# Patient Record
Sex: Male | Born: 1955 | Race: White | Hispanic: No | Marital: Single | Smoking: Former smoker
Health system: Southern US, Community
[De-identification: ages and names within clinical notes are randomized; demographics above are authoritative.]

## PROBLEM LIST (undated history)

## (undated) DIAGNOSIS — I4891 Unspecified atrial fibrillation: Secondary | ICD-10-CM

## (undated) DIAGNOSIS — E669 Obesity, unspecified: Secondary | ICD-10-CM

## (undated) DIAGNOSIS — R05 Cough: Secondary | ICD-10-CM

## (undated) DIAGNOSIS — Z9981 Dependence on supplemental oxygen: Secondary | ICD-10-CM

## (undated) DIAGNOSIS — K759 Inflammatory liver disease, unspecified: Secondary | ICD-10-CM

## (undated) DIAGNOSIS — M5126 Other intervertebral disc displacement, lumbar region: Secondary | ICD-10-CM

## (undated) DIAGNOSIS — C44629 Squamous cell carcinoma of skin of left upper limb, including shoulder: Secondary | ICD-10-CM

## (undated) DIAGNOSIS — G4730 Sleep apnea, unspecified: Secondary | ICD-10-CM

## (undated) DIAGNOSIS — J45909 Unspecified asthma, uncomplicated: Secondary | ICD-10-CM

## (undated) DIAGNOSIS — G2581 Restless legs syndrome: Secondary | ICD-10-CM

## (undated) DIAGNOSIS — L570 Actinic keratosis: Secondary | ICD-10-CM

## (undated) DIAGNOSIS — R059 Cough, unspecified: Secondary | ICD-10-CM

## (undated) DIAGNOSIS — G629 Polyneuropathy, unspecified: Secondary | ICD-10-CM

## (undated) DIAGNOSIS — R0902 Hypoxemia: Secondary | ICD-10-CM

## (undated) DIAGNOSIS — K7689 Other specified diseases of liver: Secondary | ICD-10-CM

## (undated) DIAGNOSIS — M6250 Muscle wasting and atrophy, not elsewhere classified, unspecified site: Secondary | ICD-10-CM

## (undated) DIAGNOSIS — N400 Enlarged prostate without lower urinary tract symptoms: Secondary | ICD-10-CM

## (undated) DIAGNOSIS — I429 Cardiomyopathy, unspecified: Secondary | ICD-10-CM

## (undated) DIAGNOSIS — K7460 Unspecified cirrhosis of liver: Secondary | ICD-10-CM

## (undated) DIAGNOSIS — R918 Other nonspecific abnormal finding of lung field: Secondary | ICD-10-CM

## (undated) DIAGNOSIS — K219 Gastro-esophageal reflux disease without esophagitis: Secondary | ICD-10-CM

## (undated) DIAGNOSIS — I10 Essential (primary) hypertension: Secondary | ICD-10-CM

## (undated) DIAGNOSIS — D126 Benign neoplasm of colon, unspecified: Secondary | ICD-10-CM

## (undated) DIAGNOSIS — K5900 Constipation, unspecified: Secondary | ICD-10-CM

## (undated) DIAGNOSIS — F419 Anxiety disorder, unspecified: Secondary | ICD-10-CM

## (undated) DIAGNOSIS — K769 Liver disease, unspecified: Secondary | ICD-10-CM

## (undated) HISTORY — PX: OTHER SURGICAL HISTORY: SHX169

## (undated) HISTORY — PX: CHOLECYSTECTOMY: SHX55

## (undated) HISTORY — PX: CARDIOVERSION: SHX1299

---

## 2011-06-20 HISTORY — PX: BIV PACEMAKER GENERATOR CHANGE OUT: SHX5746

## 2011-07-18 HISTORY — PX: OTHER SURGICAL HISTORY: SHX169

## 2013-10-24 ENCOUNTER — Other Ambulatory Visit: Payer: Self-pay | Admitting: Orthopedic Surgery

## 2013-10-24 DIAGNOSIS — M79606 Pain in leg, unspecified: Secondary | ICD-10-CM

## 2013-10-24 DIAGNOSIS — M5126 Other intervertebral disc displacement, lumbar region: Secondary | ICD-10-CM

## 2013-11-12 ENCOUNTER — Ambulatory Visit
Admission: RE | Admit: 2013-11-12 | Discharge: 2013-11-12 | Disposition: A | Discharge status: Home / Self Care | Payer: Medicare Other | Source: Ambulatory Visit | Attending: Orthopedic Surgery | Admitting: Orthopedic Surgery

## 2013-11-12 DIAGNOSIS — M5126 Other intervertebral disc displacement, lumbar region: Secondary | ICD-10-CM

## 2013-11-12 DIAGNOSIS — M79606 Pain in leg, unspecified: Secondary | ICD-10-CM

## 2013-11-12 MED ORDER — IOHEXOL 180 MG/ML  SOLN
15.0000 mL | Freq: Once | INTRAMUSCULAR | Status: AC | PRN
  Administered 2013-11-12: 15 mL via INTRATHECAL

## 2013-11-12 MED ORDER — MEPERIDINE HCL 100 MG/ML IJ SOLN
100.0000 mg | Freq: Once | INTRAMUSCULAR | Status: AC
  Administered 2013-11-12: 100 mg via INTRAMUSCULAR

## 2013-11-12 MED ORDER — DIAZEPAM 5 MG PO TABS
10.0000 mg | ORAL_TABLET | Freq: Once | ORAL | Status: AC
  Administered 2013-11-12: 10 mg via ORAL

## 2013-11-12 MED ORDER — ONDANSETRON HCL 4 MG/2ML IJ SOLN
4.0000 mg | Freq: Once | INTRAMUSCULAR | Status: AC
Start: 2013-11-12 — End: 2013-11-12
  Administered 2013-11-12: 4 mg via INTRAMUSCULAR

## 2013-11-12 NOTE — Progress Notes (Signed)
Patient states he has been off Trazodone and Sertraline for at the past two days.  Patient states he has been off Coumadin for the past four days; INR 1.1 11/11/13.  jkl

## 2013-11-12 NOTE — Progress Notes (Signed)
Patient usually on O2 via [place] @ 2.5L/min; bumped up to 3L and switched to our tank for myelogram.  jkl

## 2013-11-12 NOTE — Discharge Instructions (Signed)
Myelogram Discharge Instructions  1. Go home and rest quietly for the next 24 hours.  It is important to lie flat for the next 24 hours.  Get up only to go to the restroom.  You may lie in the bed or on a couch on your back, your stomach, your left side or your right side.  You may have one pillow under your head.  You may have pillows between your knees while you are on your side or under your knees while you are on your back.  2. DO NOT drive today.  Recline the seat as far back as it will go, while still wearing your seat belt, on the way home.  3. You may get up to go to the bathroom as needed.  You may sit up for 10 minutes to eat.  You may resume your normal diet and medications unless otherwise indicated.  Drink plenty of extra fluids today and tomorrow.  4. The incidence of a spinal headache with nausea and/or vomiting is about 5% (one in 20 patients).  If you develop a headache, lie flat and drink plenty of fluids until the headache goes away.  Caffeinated beverages may be helpful.  If you develop severe nausea and vomiting or a headache that does not go away with flat bed rest, call [phone].  5. You may resume normal activities after your 24 hours of bed rest is over; however, do not exert yourself strongly or do any heavy lifting tomorrow.  6. Call your physician for a follow-up appointment.   You may resume Coumadin today.  You may resume Trazodone and Sertraline on Wednesday, November 13, 2013 after 11:30a.m.

## 2013-12-05 ENCOUNTER — Other Ambulatory Visit: Payer: Self-pay | Admitting: Surgical

## 2013-12-16 ENCOUNTER — Encounter (HOSPITAL_COMMUNITY): Payer: Self-pay | Admitting: Pharmacy Technician

## 2013-12-18 ENCOUNTER — Other Ambulatory Visit (HOSPITAL_COMMUNITY): Payer: Self-pay | Admitting: Orthopedic Surgery

## 2013-12-18 ENCOUNTER — Encounter (HOSPITAL_COMMUNITY): Payer: Self-pay

## 2013-12-18 NOTE — H&P (Signed)
Robert Gould is an 58 y.o. male.   Chief Complaint: back pain HPI: Robert Gould is a 58 year old male who presented with the chief complaint of low back pain. He has been dealing with this for about 3 months with no known injury.  His chief complaint is pain in the right gluteal area radiating down his right thigh to the right knee. His pain goes all the way down his leg to his ankle. He said he can walk five minutes and he has to stop because of his leg pain. When he stops the pain goes away. He has no groin pain. CT showed disc herniation at L5-S1 on the right.   Family History Cerebrovascular Accident. Father. Chronic Obstructive Lung Disease. Brother, Mother. child Diabetes Mellitus. Father. First Degree Relatives. reported Hypertension. Father. Severe allergy. Brother.  Social History Children. 1 Current work status. disabled Exercise. Exercises weekly; does running / walking and gym / weights Former drinker. In the past drank beer and hard liquor only occasionally per week Tobacco / smoke exposure. Tobacco use. Former smoker. smoke(d) 3 or more pack(s) per day uses 2 or more can(s) smokeless per week  Past Surgical History Colon Polyp Removal - Colonoscopy Gallbladder Surgery. laporoscopic Vasectomy  Past Medical History Anxiety Disorder Asthma Bleeding disorder Chronic Obstructive Lung Disease Congestive Heart Failure Emphysema Of Lung Gastroesophageal Reflux Disease Hepatitis C Prostate Disease Skin Cancer Sleep Apnea  Allergies:  Allergies  Allergen Reactions  . Codeine Itching    Current outpatient prescriptions: albuterol (PROVENTIL HFA;VENTOLIN HFA) 108 (90 BASE) MCG/ACT inhaler, Inhale 2 puffs into the lungs every 6 (six) hours as needed for wheezing or shortness of breath., Disp: , Rfl: ; ALPRAZolam (XANAX) 0.5 MG tablet, Take 0.5 mg by mouth every 12 (twelve) hours. , Disp: , Rfl: ;   docusate sodium (COLACE) 100 MG capsule, Take 100 mg by mouth  2 (two) times daily., Disp: , Rfl:  fluticasone (FLONASE) 50 MCG/ACT nasal spray, Place 2 sprays into both nostrils daily., Disp: , Rfl: ;   Fluticasone-Salmeterol (ADVAIR) 250-50 MCG/DOSE AEPB, Inhale 1 puff into the lungs 2 (two) times daily., Disp: , Rfl: ;   gabapentin (NEURONTIN) 300 MG capsule, Take 300-600 mg by mouth at bedtime. , Disp: , Rfl: ;   guaiFENesin (MUCINEX) 600 MG 12 hr tablet, Take 600 mg by mouth 2 (two) times daily., Disp: , Rfl:  omeprazole (PRILOSEC) 40 MG capsule, Take 40 mg by mouth 2 (two) times daily., Disp: , Rfl: ;   oxyCODONE-acetaminophen (PERCOCET) 10-325 MG per tablet, Take 1 tablet by mouth every 6 (six) hours as needed for pain., Disp: , Rfl: ;   polyethylene glycol powder (GLYCOLAX/MIRALAX) powder, Take 1 Container by mouth daily. , Disp: , Rfl: ;   psyllium (REGULOID) 0.52 G capsule, Take 3 capsules by mouth daily. , Disp: , Rfl:  rOPINIRole (REQUIP) 1 MG tablet, Take 1 mg by mouth at bedtime., Disp: , Rfl: ;   sertraline (ZOLOFT) 100 MG tablet, Take 100 mg by mouth daily after supper., Disp: , Rfl: ;   simethicone (MYLICON) 80 MG chewable tablet, Chew 80 mg by mouth 4 (four) times daily - after meals and at bedtime., Disp: , Rfl: ;   spironolactone (ALDACTONE) 25 MG tablet, Take 25 mg by mouth every morning. , Disp: , Rfl:  tamsulosin (FLOMAX) 0.4 MG CAPS capsule, Take 0.8 mg by mouth at bedtime. , Disp: , Rfl: ;   tiotropium (SPIRIVA) 18 MCG inhalation capsule, Place 18 mcg into inhaler  and inhale daily., Disp: , Rfl: ;   traZODone (DESYREL) 50 MG tablet, Take 50 mg by mouth at bedtime., Disp: , Rfl: ;   warfarin (COUMADIN) 5 MG tablet, Take 5 mg by mouth every morning. , Disp: , Rfl:   Review of Systems  Constitutional: Positive for weight loss. Negative for fever, chills, malaise/fatigue and diaphoresis.  HENT: Negative.   Eyes: Negative.   Respiratory: Positive for shortness of breath. Negative for cough, hemoptysis, sputum production and wheezing.         SOB on exertion  Cardiovascular: Negative.   Gastrointestinal: Positive for heartburn. Negative for nausea, vomiting, abdominal pain, diarrhea, constipation, blood in stool and melena.  Genitourinary: Negative.   Musculoskeletal: Positive for back pain and joint pain. Negative for falls, myalgias and neck pain.  Skin: Negative.   Neurological: Positive for dizziness, tremors and weakness. Negative for tingling, sensory change, speech change, focal weakness, seizures and loss of consciousness.  Endo/Heme/Allergies: Negative for environmental allergies and polydipsia. Bruises/bleeds easily.  Psychiatric/Behavioral: Negative for depression, suicidal ideas, hallucinations, memory loss and substance abuse. The patient is nervous/anxious. The patient does not have insomnia.    Vitals Weight: 189 lb Height: 71 in Body Surface Area: 2.07 m Body Mass Index: 26.36 kg/m Pulse: 57 (Regular) BP: 120/77 (Sitting, Left Arm, Standard)  Physical Exam  Constitutional: He is oriented to person, place, and time. He appears well-developed and well-nourished. No distress.  HENT:  Head: Normocephalic and atraumatic.  Right Ear: External ear normal.  Left Ear: External ear normal.  Nose: Nose normal.  Mouth/Throat: Oropharynx is clear and moist.  Eyes: Conjunctivae and EOM are normal.  Neck: Normal range of motion. Neck supple.  Cardiovascular: Normal rate, regular rhythm, normal heart sounds and intact distal pulses.   No murmur heard. Respiratory: Effort normal. No respiratory distress. He has decreased breath sounds. He has no wheezes.  GI: Soft. Bowel sounds are normal. He exhibits no distension. There is no tenderness.  Musculoskeletal:       Right hip: Normal.       Left hip: Normal.       Right knee: Normal.       Left knee: Normal.       Lumbar back: He exhibits decreased range of motion, tenderness, pain and spasm.       Right lower leg: He exhibits no tenderness and no  swelling.       Left lower leg: He exhibits no tenderness and no swelling.       Right foot: He exhibits decreased range of motion.       Left foot: Normal.  Neurological: He is alert and oriented to person, place, and time. He has normal reflexes. A sensory deficit is present.  Weakness of toe extensors and dorsiflexors on the right LE  Skin: No rash noted. He is not diaphoretic. No erythema.  Psychiatric: He has a normal mood and affect. His behavior is normal.     Assessment/Plan Lumbar disc herniation L5-S1 right He needs a hemilaminectomy and microdiscectomy L5-S1 right. The possible complications of spinal surgery number one could be infection, which is extremely rare. We do use antibiotics prior to the surgery and during surgery and after surgery. Number two is always a slight degree of probability that you could develop a blood clot in your leg after any type of surgery and we try our best to prevent that with aspirin post op when it is safe to begin. The third is a dural  leak. That is the spinal fluid leak that could occur. At certain rare times the bone or the disc could literally stick to the dura which is the lining which contains the spinal fluid and we could develop a small tear in that lining which we then patch up. That is an extremely rare complication. The last and final complication is a recurrent disc rupture. That means that you could rupture another small piece of disc later on down the road and there is about a 2% chance of that. He will discontinue his Coumadin 5 days prior to surgery.   H&P performed by Dr. Latanya Maudlin H&P documented by Ardeen Jourdain, [place], Kasee Hantz Ander Purpura 12/18/2013, 8:50 AM

## 2013-12-18 NOTE — Patient Instructions (Addendum)
[place]  12/18/2013   Your procedure is scheduled on: Wednesday September 9th, 2015  Report to [facility] Main Entrance and follow signs to  [place] at 630 AM.  Call this number if you have problems the morning of surgery [phone]   Remember:BRING CPAP MASK AND TUBING, NO SKOAL AFTRE MIDNIGHT NIGHT BEFORE SURGERY.  Do not eat food or drink liquids :After Midnight.     Take these medicines the morning of surgery with A SIP OF WATER: ALBUTEROL NEBULIZER, ALBUTEROL INHALER, FLONASE NASAL SPRAY, ADVAIR, PRILOSEC, SPIRIVA                               You may not have any metal on your body including hair pins and piercings  Do not wear jewelry, make-up, lotions, powders, or deodorant.   Men may shave face and neck.  Do not bring valuables to the hospital. [place].  Contacts, dentures or bridgework may not be worn into surgery.  Leave suitcase in the car. After surgery it may be brought to your room.  For patients admitted to the hospital, checkout time is 11:00 AM the day of discharge.   ________________________________________________________________________  [facility] - Preparing for Surgery Before surgery, you can play an important role.  Because skin is not sterile, your skin needs to be as free of germs as possible.  You can reduce the number of germs on your skin by washing with CHG (chlorahexidine gluconate) soap before surgery.  CHG is an antiseptic cleaner which kills germs and bonds with the skin to continue killing germs even after washing. Please DO NOT use if you have an allergy to CHG or antibacterial soaps.  If your skin becomes reddened/irritated stop using the CHG and inform your nurse when you arrive at Short Stay. Do not shave (including legs and underarms) for at least 48 hours prior to the first CHG shower.  You may shave your face/neck. Please follow these instructions carefully:  1.  Shower with CHG  Soap the night before surgery and the  morning of Surgery.  2.  If you choose to wash your hair, wash your hair first as usual with your  normal  shampoo.  3.  After you shampoo, rinse your hair and body thoroughly to remove the  shampoo.                           4.  Use CHG as you would any other liquid soap.  You can apply chg directly  to the skin and wash                       Gently with a scrungie or clean washcloth.  5.  Apply the CHG Soap to your body ONLY FROM THE NECK DOWN.   Do not use on face/ open                           Wound or open sores. Avoid contact with eyes, ears mouth and genitals (private parts).                       Wash face,  Genitals (private parts) with your normal soap.             6.  Wash thoroughly, paying special attention to the area where your surgery  will be performed.  7.  Thoroughly rinse your body with warm water from the neck down.  8.  DO NOT shower/wash with your normal soap after using and rinsing off  the CHG Soap.                9.  Pat yourself dry with a clean towel.            10.  Wear clean pajamas.            11.  Place clean sheets on your bed the night of your first shower and do not  sleep with pets. Day of Surgery : Do not apply any lotions/deodorants the morning of surgery.  Please wear clean clothes to the hospital/surgery center.  FAILURE TO FOLLOW THESE INSTRUCTIONS MAY RESULT IN THE CANCELLATION OF YOUR SURGERY PATIENT SIGNATURE_________________________________  NURSE SIGNATURE__________________________________  ________________________________________________________________________   Robert Gould  An incentive spirometer is a tool that can help keep your lungs clear and active. This tool measures how well you are filling your lungs with each breath. Taking long deep breaths may help reverse or decrease the chance of developing breathing (pulmonary) problems (especially infection) following:  A long period of time  when you are unable to move or be active. BEFORE THE PROCEDURE   If the spirometer includes an indicator to show your best effort, your nurse or respiratory therapist will set it to a desired goal.  If possible, sit up straight or lean slightly forward. Try not to slouch.  Hold the incentive spirometer in an upright position. INSTRUCTIONS FOR USE  1. Sit on the edge of your bed if possible, or sit up as far as you can in bed or on a chair. 2. Hold the incentive spirometer in an upright position. 3. Breathe out normally. 4. Place the mouthpiece in your mouth and seal your lips tightly around it. 5. Breathe in slowly and as deeply as possible, raising the piston or the ball toward the top of the column. 6. Hold your breath for 3-5 seconds or for as long as possible. Allow the piston or ball to fall to the bottom of the column. 7. Remove the mouthpiece from your mouth and breathe out normally. 8. Rest for a few seconds and repeat Steps 1 through 7 at least 10 times every 1-2 hours when you are awake. Take your time and take a few normal breaths between deep breaths. 9. The spirometer may include an indicator to show your best effort. Use the indicator as a goal to work toward during each repetition. 10. After each set of 10 deep breaths, practice coughing to be sure your lungs are clear. If you have an incision (the cut made at the time of surgery), support your incision when coughing by placing a pillow or rolled up towels firmly against it. Once you are able to get out of bed, walk around indoors and cough well. You may stop using the incentive spirometer when instructed by your caregiver.  RISKS AND COMPLICATIONS  Take your time so you do not get dizzy or light-headed.  If you are in pain, you may need to take or ask for pain medication before doing incentive spirometry. It is harder to take a deep breath if you are having pain. AFTER USE  Rest and breathe slowly and easily.  It can be  helpful to keep track of a log of your progress.  Your caregiver can provide you with a simple table to help with this. If you are using the spirometer at home, follow these instructions: [place] IF:   You are having difficultly using the spirometer.  You have trouble using the spirometer as often as instructed.  Your pain medication is not giving enough relief while using the spirometer.  You develop fever of 100.5 F (38.1 C) or higher. SEEK IMMEDIATE MEDICAL CARE IF:   You cough up bloody sputum that had not been present before.  You develop fever of 102 F (38.9 C) or greater.  You develop worsening pain at or near the incision site. MAKE SURE YOU:   Understand these instructions.  Will watch your condition.  Will get help right away if you are not doing well or get worse. Document Released: 08/15/2006 Document Revised: 06/27/2011 Document Reviewed: 10/16/2006 [facility] Patient Information 2014 [place], [place].   ________________________________________________________________________

## 2013-12-18 NOTE — Progress Notes (Addendum)
Medical clearance note/lov dr Gerarda Fraction 12-03-13 on chart for 12-25-13 surgery Pulmonary clearance/lov note dr Caryl Ada pulmonology  12-18-13 on chart lov note dr Elonda Husky cardiology 09-06-12 on chart ekg 12-03-13 [facility] cardiology on chart  medtronic icd orders dr Elonda Husky on chart tte report  08-17-12 on chart Av node ablation report 09-19-11 [facility] on chart medtronic biv ICD last check note 01-09-13 [facility] cardiology on chart Chest xray 2 view 12-02-13 [facility] on chart Chest ct 12-17-13 [facility] on chart

## 2013-12-19 ENCOUNTER — Ambulatory Visit (HOSPITAL_COMMUNITY)
Admission: RE | Admit: 2013-12-19 | Discharge: 2013-12-19 | Disposition: A | Discharge status: Home / Self Care | Payer: Medicare Other | Source: Ambulatory Visit | Attending: Surgical | Admitting: Surgical

## 2013-12-19 ENCOUNTER — Encounter (HOSPITAL_COMMUNITY): Payer: Self-pay

## 2013-12-19 ENCOUNTER — Encounter (HOSPITAL_COMMUNITY)
Admission: RE | Admit: 2013-12-19 | Discharge: 2013-12-19 | Disposition: A | Discharge status: Home / Self Care | Payer: Medicare Other | Source: Ambulatory Visit | Attending: Orthopedic Surgery | Admitting: Orthopedic Surgery

## 2013-12-19 DIAGNOSIS — Z01818 Encounter for other preprocedural examination: Secondary | ICD-10-CM | POA: Diagnosis present

## 2013-12-19 HISTORY — DX: Anxiety disorder, unspecified: F41.9

## 2013-12-19 HISTORY — DX: Constipation, unspecified: K59.00

## 2013-12-19 HISTORY — DX: Dependence on supplemental oxygen: Z99.81

## 2013-12-19 HISTORY — DX: Cardiomyopathy, unspecified: I42.9

## 2013-12-19 HISTORY — DX: Liver disease, unspecified: K76.9

## 2013-12-19 HISTORY — DX: Obesity, unspecified: E66.9

## 2013-12-19 HISTORY — DX: Hypoxemia: R09.02

## 2013-12-19 HISTORY — DX: Actinic keratosis: L57.0

## 2013-12-19 HISTORY — DX: Muscle wasting and atrophy, not elsewhere classified, unspecified site: M62.50

## 2013-12-19 HISTORY — DX: Other specified diseases of liver: K76.89

## 2013-12-19 HISTORY — DX: Inflammatory liver disease, unspecified: K75.9

## 2013-12-19 HISTORY — DX: Cough, unspecified: R05.9

## 2013-12-19 HISTORY — DX: Squamous cell carcinoma of skin of left upper limb, including shoulder: C44.629

## 2013-12-19 HISTORY — DX: Sleep apnea, unspecified: G47.30

## 2013-12-19 HISTORY — DX: Cough: R05

## 2013-12-19 HISTORY — DX: Other intervertebral disc displacement, lumbar region: M51.26

## 2013-12-19 HISTORY — DX: Polyneuropathy, unspecified: G62.9

## 2013-12-19 HISTORY — DX: Essential (primary) hypertension: I10

## 2013-12-19 HISTORY — DX: Restless legs syndrome: G25.81

## 2013-12-19 HISTORY — DX: Unspecified cirrhosis of liver: K74.60

## 2013-12-19 HISTORY — DX: Other nonspecific abnormal finding of lung field: R91.8

## 2013-12-19 HISTORY — DX: Gastro-esophageal reflux disease without esophagitis: K21.9

## 2013-12-19 HISTORY — DX: Benign prostatic hyperplasia without lower urinary tract symptoms: N40.0

## 2013-12-19 HISTORY — DX: Unspecified asthma, uncomplicated: J45.909

## 2013-12-19 HISTORY — DX: Unspecified atrial fibrillation: I48.91

## 2013-12-19 HISTORY — DX: Benign neoplasm of colon, unspecified: D12.6

## 2013-12-19 LAB — COMPREHENSIVE METABOLIC PANEL
ALT: 13 U/L (ref 0–53)
AST: 17 U/L (ref 0–37)
Albumin: 3.9 g/dL (ref 3.5–5.2)
Alkaline Phosphatase: 115 U/L (ref 39–117)
Anion gap: 12 (ref 5–15)
BUN: 8 mg/dL (ref 6–23)
CO2: 27 mEq/L (ref 19–32)
Calcium: 9.3 mg/dL (ref 8.4–10.5)
Chloride: 100 mEq/L (ref 96–112)
Creatinine, Ser: 0.75 mg/dL (ref 0.50–1.35)
GFR calc Af Amer: >90 mL/min (ref >90)
GFR calc non Af Amer: >90 mL/min (ref >90)
Glucose, Bld: 103 mg/dL — ABNORMAL HIGH (ref 70–99)
Potassium: 4.4 mEq/L (ref 3.7–5.3)
Sodium: 139 mEq/L (ref 137–147)
Total Bilirubin: 0.6 mg/dL (ref 0.3–1.2)
Total Protein: 7.1 g/dL (ref 6.0–8.3)

## 2013-12-19 LAB — URINALYSIS, ROUTINE W REFLEX MICROSCOPIC
Bilirubin Urine: NEGATIVE
Glucose, UA: NEGATIVE mg/dL
Hgb urine dipstick: NEGATIVE
Ketones, ur: NEGATIVE mg/dL
Leukocytes, UA: NEGATIVE
Nitrite: NEGATIVE
Protein, ur: NEGATIVE mg/dL
Specific Gravity, Urine: 1.013 (ref 1.005–1.030)
Urobilinogen, UA: 0.2 mg/dL (ref 0.0–1.0)
pH: 5.5 (ref 5.0–8.0)

## 2013-12-19 LAB — CBC
HCT: 45.9 % (ref 39.0–52.0)
HEMOGLOBIN: 15.3 g/dL (ref 13.0–17.0)
MCH: 31.7 pg (ref 26.0–34.0)
MCHC: 33.3 g/dL (ref 30.0–36.0)
MCV: 95 fL (ref 78.0–100.0)
Platelets: 159 10*3/uL (ref 150–400)
RBC: 4.83 MIL/uL (ref 4.22–5.81)
RDW: 13.8 % (ref 11.5–15.5)
WBC: 8.5 10*3/uL (ref 4.0–10.5)

## 2013-12-19 LAB — APTT: aPTT: 32 seconds (ref 24–37)

## 2013-12-19 LAB — PROTIME-INR
INR: 1.55 — ABNORMAL HIGH (ref 0.00–1.49)
Prothrombin Time: 18.6 seconds — ABNORMAL HIGH (ref 11.6–15.2)

## 2013-12-19 LAB — SURGICAL PCR SCREEN
MRSA, PCR: NEGATIVE
STAPHYLOCOCCUS AUREUS: NEGATIVE

## 2013-12-24 NOTE — Progress Notes (Signed)
Pt/inr results drawn 12-24-2013 [facility]  On chart

## 2013-12-24 NOTE — Anesthesia Preprocedure Evaluation (Addendum)
Anesthesia Evaluation  Patient identified by MRN, date of birth, ID band Patient awake    Reviewed: Allergy & Precautions, H&P , NPO status , Patient's Chart, lab work & pertinent test results  History of Anesthesia Complications Negative for: history of anesthetic complications  Airway Mallampati: III TM Distance: >3 FB Neck ROM: Full    Dental no notable dental hx.    Pulmonary asthma , sleep apnea and Continuous Positive Airway Pressure Ventilation , COPD oxygen dependent, former smoker,  2.5L O2 at night, hx of COPD with pulmonary fibrosis, cleared by his pulmonologist to proceed Dr. Rogue Jury if no acute exacerbation present at time of surgery breath sounds clear to auscultation  Pulmonary exam normal       Cardiovascular Exercise Tolerance: Poor hypertension, Pt. on medications + dysrhythmias Atrial Fibrillation + pacemaker Rhythm:Irregular Rate:Normal     Neuro/Psych Anxiety Depression  Neuromuscular disease negative neurological ROS     GI/Hepatic GERD-  Medicated and Controlled,(+) Hepatitis -, C  Endo/Other  negative endocrine ROS  Renal/GU negative Renal ROS  negative genitourinary   Musculoskeletal Disk herniation at L5-S1 on the R with right gluteal and lower leg pain and radiculopathy   Abdominal   Peds negative pediatric ROS (+)  Hematology negative hematology ROS (+)   Anesthesia Other Findings   Reproductive/Obstetrics negative OB ROS                         Anesthesia Physical Anesthesia Plan  ASA: III  Anesthesia Plan: General   Post-op Pain Management:    Induction: Intravenous  Airway Management Planned: Oral ETT  Additional Equipment: Arterial line  Intra-op Plan:   Post-operative Plan: Extubation in OR and Possible Post-op intubation/ventilation  Informed Consent: I have reviewed the patients History and Physical, chart, labs and discussed the procedure  including the risks, benefits and alternatives for the proposed anesthesia with the patient or authorized representative who has indicated his/her understanding and acceptance.   Dental advisory given  Plan Discussed with: CRNA  Anesthesia Plan Comments: (Patient has gotten both pulmonary and medical clearance from both Dr. Rogue Jury and Dr. Ernie Avena for this procedure as well as instructions for medtronic pacer device however discussed that given his past medical history he is at increased risk for cardiac and pulmonary complications intra and postoperatively. Discussed the possibility of postoperative ventilation and additional monitoring such as an arterial line for the case. The patient reports that he is currently in his normal state of health and denies any acute exacerbation of symptoms. He wishes to proceed and expressed an understanding of the risks. )        Anesthesia Quick Evaluation

## 2013-12-25 ENCOUNTER — Encounter (HOSPITAL_COMMUNITY): Payer: Self-pay | Admitting: *Deleted

## 2013-12-25 ENCOUNTER — Encounter (HOSPITAL_COMMUNITY)
Admission: RE | Disposition: A | Discharge status: Home / Self Care | Payer: Self-pay | Source: Ambulatory Visit | Attending: Orthopedic Surgery

## 2013-12-25 ENCOUNTER — Ambulatory Visit (HOSPITAL_COMMUNITY): Payer: Medicare Other

## 2013-12-25 ENCOUNTER — Inpatient Hospital Stay (HOSPITAL_COMMUNITY)
Admission: RE | Admit: 2013-12-25 | Discharge: 2013-12-27 | DRG: 520 | Disposition: A | Discharge status: Home / Self Care | Payer: Medicare Other | Source: Ambulatory Visit | Attending: Orthopedic Surgery | Admitting: Orthopedic Surgery

## 2013-12-25 ENCOUNTER — Ambulatory Visit (HOSPITAL_COMMUNITY): Payer: Medicare Other | Admitting: Anesthesiology

## 2013-12-25 ENCOUNTER — Encounter (HOSPITAL_COMMUNITY): Payer: Medicare Other | Admitting: Anesthesiology

## 2013-12-25 DIAGNOSIS — J8410 Pulmonary fibrosis, unspecified: Secondary | ICD-10-CM | POA: Diagnosis present

## 2013-12-25 DIAGNOSIS — K219 Gastro-esophageal reflux disease without esophagitis: Secondary | ICD-10-CM | POA: Diagnosis present

## 2013-12-25 DIAGNOSIS — M5126 Other intervertebral disc displacement, lumbar region: Principal | ICD-10-CM | POA: Diagnosis present

## 2013-12-25 DIAGNOSIS — Z9981 Dependence on supplemental oxygen: Secondary | ICD-10-CM | POA: Diagnosis not present

## 2013-12-25 DIAGNOSIS — I4891 Unspecified atrial fibrillation: Secondary | ICD-10-CM | POA: Diagnosis present

## 2013-12-25 DIAGNOSIS — F411 Generalized anxiety disorder: Secondary | ICD-10-CM | POA: Diagnosis present

## 2013-12-25 DIAGNOSIS — Z87891 Personal history of nicotine dependence: Secondary | ICD-10-CM | POA: Diagnosis not present

## 2013-12-25 DIAGNOSIS — B1920 Unspecified viral hepatitis C without hepatic coma: Secondary | ICD-10-CM | POA: Diagnosis present

## 2013-12-25 DIAGNOSIS — Z950 Presence of cardiac pacemaker: Secondary | ICD-10-CM

## 2013-12-25 DIAGNOSIS — G4730 Sleep apnea, unspecified: Secondary | ICD-10-CM | POA: Diagnosis present

## 2013-12-25 DIAGNOSIS — Z6825 Body mass index (BMI) 25.0-25.9, adult: Secondary | ICD-10-CM | POA: Diagnosis not present

## 2013-12-25 DIAGNOSIS — J438 Other emphysema: Secondary | ICD-10-CM | POA: Diagnosis present

## 2013-12-25 DIAGNOSIS — J45909 Unspecified asthma, uncomplicated: Secondary | ICD-10-CM | POA: Diagnosis present

## 2013-12-25 DIAGNOSIS — N429 Disorder of prostate, unspecified: Secondary | ICD-10-CM | POA: Diagnosis present

## 2013-12-25 DIAGNOSIS — M48062 Spinal stenosis, lumbar region with neurogenic claudication: Secondary | ICD-10-CM | POA: Diagnosis present

## 2013-12-25 HISTORY — PX: HEMI-MICRODISCECTOMY LUMBAR LAMINECTOMY LEVEL 1: SHX5846

## 2013-12-25 SURGERY — HEMI-MICRODISCECTOMY LUMBAR LAMINECTOMY LEVEL 1
Anesthesia: General | Site: Back | Laterality: Right

## 2013-12-25 MED ORDER — OXYCODONE HCL 5 MG PO TABS
5.0000 mg | ORAL_TABLET | ORAL | Status: DC | PRN
  Administered 2013-12-25 – 2013-12-26 (×2): 5 mg via ORAL
  Filled 2013-12-25 (×3): qty 1

## 2013-12-25 MED ORDER — BUPIVACAINE LIPOSOME 1.3 % IJ SUSP
20.0000 mL | Freq: Once | INTRAMUSCULAR | Status: DC
Start: 2013-12-25 — End: 2013-12-25
  Filled 2013-12-25 (×2): qty 20

## 2013-12-25 MED ORDER — PSYLLIUM 0.52 G PO CAPS: 3.0000 | ORAL_CAPSULE | Freq: Every day | ORAL | Status: DC

## 2013-12-25 MED ORDER — GLYCOPYRROLATE 0.2 MG/ML IJ SOLN
INTRAMUSCULAR | Status: DC | PRN
  Administered 2013-12-25: 0.6 mg via INTRAVENOUS

## 2013-12-25 MED ORDER — SUFENTANIL CITRATE 50 MCG/ML IV SOLN
INTRAVENOUS | Status: AC
  Filled 2013-12-25: qty 1

## 2013-12-25 MED ORDER — MIDAZOLAM HCL 2 MG/2ML IJ SOLN
INTRAMUSCULAR | Status: AC
  Filled 2013-12-25: qty 2

## 2013-12-25 MED ORDER — CEFAZOLIN SODIUM-DEXTROSE 2-3 GM-% IV SOLR
INTRAVENOUS | Status: AC
  Filled 2013-12-25: qty 50

## 2013-12-25 MED ORDER — ROPINIROLE HCL 1 MG PO TABS
1.0000 mg | ORAL_TABLET | Freq: Every day | ORAL | Status: DC
  Administered 2013-12-25 – 2013-12-26 (×2): 1 mg via ORAL
  Filled 2013-12-25 (×3): qty 1

## 2013-12-25 MED ORDER — POLYETHYLENE GLYCOL 3350 17 G PO PACK: 17.0000 g | PACK | Freq: Every day | ORAL | Status: DC | PRN

## 2013-12-25 MED ORDER — CEFAZOLIN SODIUM-DEXTROSE 2-3 GM-% IV SOLR
2.0000 g | INTRAVENOUS | Status: AC
  Administered 2013-12-25: 2 g via INTRAVENOUS

## 2013-12-25 MED ORDER — DEXAMETHASONE SODIUM PHOSPHATE 10 MG/ML IJ SOLN
INTRAMUSCULAR | Status: DC | PRN
  Administered 2013-12-25: 10 mg via INTRAVENOUS

## 2013-12-25 MED ORDER — ALPRAZOLAM 0.5 MG PO TABS
0.5000 mg | ORAL_TABLET | Freq: Two times a day (BID) | ORAL | Status: DC
  Administered 2013-12-25 – 2013-12-27 (×3): 0.5 mg via ORAL
  Filled 2013-12-25 (×4): qty 1

## 2013-12-25 MED ORDER — EPHEDRINE SULFATE 50 MG/ML IJ SOLN
INTRAMUSCULAR | Status: AC
  Filled 2013-12-25: qty 1

## 2013-12-25 MED ORDER — CEFAZOLIN SODIUM 1-5 GM-% IV SOLN
1.0000 g | Freq: Three times a day (TID) | INTRAVENOUS | Status: AC
  Administered 2013-12-25 – 2013-12-26 (×3): 1 g via INTRAVENOUS
  Filled 2013-12-25 (×3): qty 50

## 2013-12-25 MED ORDER — BUPIVACAINE-EPINEPHRINE (PF) 0.5% -1:200000 IJ SOLN
INTRAMUSCULAR | Status: AC
  Filled 2013-12-25: qty 30

## 2013-12-25 MED ORDER — SPIRONOLACTONE 25 MG PO TABS
25.0000 mg | ORAL_TABLET | Freq: Every morning | ORAL | Status: DC
  Administered 2013-12-25: 25 mg via ORAL
  Filled 2013-12-25 (×3): qty 1

## 2013-12-25 MED ORDER — TRAZODONE HCL 50 MG PO TABS
50.0000 mg | ORAL_TABLET | Freq: Every day | ORAL | Status: DC
  Administered 2013-12-25 – 2013-12-26 (×2): 50 mg via ORAL
  Filled 2013-12-25 (×4): qty 1

## 2013-12-25 MED ORDER — BUPIVACAINE-EPINEPHRINE 0.25% -1:200000 IJ SOLN
INTRAMUSCULAR | Status: DC | PRN
  Administered 2013-12-25: 20 mL

## 2013-12-25 MED ORDER — GLYCOPYRROLATE 0.2 MG/ML IJ SOLN
INTRAMUSCULAR | Status: AC
  Filled 2013-12-25: qty 3

## 2013-12-25 MED ORDER — SODIUM CHLORIDE 0.9 % IV SOLN
20.0000 mg | INTRAVENOUS | Status: DC | PRN
  Administered 2013-12-25: 20 ug/min via INTRAVENOUS

## 2013-12-25 MED ORDER — PROPOFOL 10 MG/ML IV BOLUS
INTRAVENOUS | Status: DC | PRN
  Administered 2013-12-25: 110 mg via INTRAVENOUS

## 2013-12-25 MED ORDER — OXYCODONE-ACETAMINOPHEN 10-325 MG PO TABS: 1.0000 | ORAL_TABLET | ORAL | Status: DC | PRN

## 2013-12-25 MED ORDER — NEOSTIGMINE METHYLSULFATE 10 MG/10ML IV SOLN
INTRAVENOUS | Status: DC | PRN
  Administered 2013-12-25: 4 mg via INTRAVENOUS

## 2013-12-25 MED ORDER — FLUTICASONE PROPIONATE 50 MCG/ACT NA SUSP: 2.0000 | NASAL | Status: DC | PRN

## 2013-12-25 MED ORDER — PANTOPRAZOLE SODIUM 40 MG PO TBEC
40.0000 mg | DELAYED_RELEASE_TABLET | Freq: Every day | ORAL | Status: DC
  Administered 2013-12-27: 40 mg via ORAL
  Filled 2013-12-25 (×3): qty 1

## 2013-12-25 MED ORDER — SUCCINYLCHOLINE CHLORIDE 20 MG/ML IJ SOLN
INTRAMUSCULAR | Status: DC | PRN
  Administered 2013-12-25: 100 mg via INTRAVENOUS

## 2013-12-25 MED ORDER — SODIUM CHLORIDE 0.9 % IJ SOLN
INTRAMUSCULAR | Status: AC
  Filled 2013-12-25: qty 10

## 2013-12-25 MED ORDER — GABAPENTIN 300 MG PO CAPS
300.0000 mg | ORAL_CAPSULE | Freq: Every day | ORAL | Status: DC
  Administered 2013-12-25 – 2013-12-26 (×2): 300 mg via ORAL
  Filled 2013-12-25 (×3): qty 1

## 2013-12-25 MED ORDER — BACITRACIN ZINC 500 UNIT/GM EX OINT
TOPICAL_OINTMENT | CUTANEOUS | Status: AC
  Filled 2013-12-25: qty 28.35

## 2013-12-25 MED ORDER — OXYCODONE-ACETAMINOPHEN 5-325 MG PO TABS
1.0000 | ORAL_TABLET | ORAL | Status: DC | PRN
  Administered 2013-12-25 – 2013-12-26 (×2): 1 via ORAL
  Filled 2013-12-25 (×3): qty 1

## 2013-12-25 MED ORDER — THROMBIN 5000 UNITS EX SOLR
OROMUCOSAL | Status: DC | PRN
  Administered 2013-12-25: 09:00:00 via TOPICAL

## 2013-12-25 MED ORDER — PSYLLIUM 95 % PO PACK
1.0000 | PACK | Freq: Every day | ORAL | Status: DC
  Administered 2013-12-25 – 2013-12-27 (×2): 1 via ORAL
  Filled 2013-12-25 (×3): qty 1

## 2013-12-25 MED ORDER — MENTHOL 3 MG MT LOZG: 1.0000 | LOZENGE | OROMUCOSAL | Status: DC | PRN

## 2013-12-25 MED ORDER — CISATRACURIUM BESYLATE (PF) 10 MG/5ML IV SOLN
INTRAVENOUS | Status: DC | PRN
  Administered 2013-12-25: 4 mg via INTRAVENOUS
  Administered 2013-12-25 (×2): 2 mg via INTRAVENOUS

## 2013-12-25 MED ORDER — LACTATED RINGERS IV SOLN
INTRAVENOUS | Status: DC
  Administered 2013-12-25 – 2013-12-27 (×2): via INTRAVENOUS

## 2013-12-25 MED ORDER — ALBUTEROL SULFATE HFA 108 (90 BASE) MCG/ACT IN AERS: 2.0000 | INHALATION_SPRAY | Freq: Four times a day (QID) | RESPIRATORY_TRACT | Status: DC

## 2013-12-25 MED ORDER — SERTRALINE HCL 100 MG PO TABS
100.0000 mg | ORAL_TABLET | ORAL | Status: DC
  Administered 2013-12-25: 100 mg via ORAL
  Filled 2013-12-25: qty 1

## 2013-12-25 MED ORDER — ACETAMINOPHEN 325 MG PO TABS: 650.0000 mg | ORAL_TABLET | ORAL | Status: DC | PRN

## 2013-12-25 MED ORDER — BISACODYL 5 MG PO TBEC
5.0000 mg | DELAYED_RELEASE_TABLET | Freq: Every day | ORAL | Status: DC | PRN
  Administered 2013-12-26: 5 mg via ORAL

## 2013-12-25 MED ORDER — MOMETASONE FURO-FORMOTEROL FUM 100-5 MCG/ACT IN AERO
2.0000 | INHALATION_SPRAY | Freq: Two times a day (BID) | RESPIRATORY_TRACT | Status: DC
  Administered 2013-12-25 – 2013-12-27 (×3): 2 via RESPIRATORY_TRACT
  Filled 2013-12-25: qty 8.8

## 2013-12-25 MED ORDER — PHENYLEPHRINE HCL 10 MG/ML IJ SOLN
INTRAMUSCULAR | Status: AC
  Filled 2013-12-25: qty 2

## 2013-12-25 MED ORDER — METHOCARBAMOL 500 MG PO TABS
500.0000 mg | ORAL_TABLET | Freq: Four times a day (QID) | ORAL | Status: DC | PRN
  Administered 2013-12-26 – 2013-12-27 (×2): 500 mg via ORAL
  Filled 2013-12-25 (×4): qty 1

## 2013-12-25 MED ORDER — ALBUTEROL SULFATE (2.5 MG/3ML) 0.083% IN NEBU
2.5000 mg | INHALATION_SOLUTION | Freq: Four times a day (QID) | RESPIRATORY_TRACT | Status: DC
  Administered 2013-12-25: 2.5 mg via RESPIRATORY_TRACT
  Filled 2013-12-25: qty 3

## 2013-12-25 MED ORDER — FLEET ENEMA 7-19 GM/118ML RE ENEM: 1.0000 | ENEMA | Freq: Once | RECTAL | Status: AC | PRN

## 2013-12-25 MED ORDER — HYDROMORPHONE HCL PF 1 MG/ML IJ SOLN
1.0000 mg | INTRAMUSCULAR | Status: DC | PRN
  Administered 2013-12-25 – 2013-12-27 (×12): 1 mg via INTRAVENOUS
  Filled 2013-12-25 (×11): qty 1

## 2013-12-25 MED ORDER — SODIUM CHLORIDE 0.9 % IR SOLN
Status: AC
  Filled 2013-12-25: qty 1

## 2013-12-25 MED ORDER — THROMBIN 5000 UNITS EX SOLR
CUTANEOUS | Status: AC
  Filled 2013-12-25: qty 10000

## 2013-12-25 MED ORDER — BUPIVACAINE LIPOSOME 1.3 % IJ SUSP
INTRAMUSCULAR | Status: DC | PRN
  Administered 2013-12-25: 20 mL

## 2013-12-25 MED ORDER — ONDANSETRON HCL 4 MG/2ML IJ SOLN
INTRAMUSCULAR | Status: DC | PRN
  Administered 2013-12-25: 4 mg via INTRAVENOUS

## 2013-12-25 MED ORDER — ONDANSETRON HCL 4 MG/2ML IJ SOLN
4.0000 mg | INTRAMUSCULAR | Status: DC | PRN
  Administered 2013-12-26 (×2): 4 mg via INTRAVENOUS
  Filled 2013-12-25 (×2): qty 2

## 2013-12-25 MED ORDER — GUAIFENESIN ER 600 MG PO TB12
600.0000 mg | ORAL_TABLET | Freq: Two times a day (BID) | ORAL | Status: DC
  Administered 2013-12-25 – 2013-12-27 (×3): 600 mg via ORAL
  Filled 2013-12-25 (×5): qty 1

## 2013-12-25 MED ORDER — LIDOCAINE HCL (CARDIAC) 20 MG/ML IV SOLN
INTRAVENOUS | Status: DC | PRN
  Administered 2013-12-25: 100 mg via INTRAVENOUS

## 2013-12-25 MED ORDER — PHENOL 1.4 % MT LIQD: 1.0000 | OROMUCOSAL | Status: DC | PRN

## 2013-12-25 MED ORDER — PROMETHAZINE HCL 25 MG/ML IJ SOLN: 6.2500 mg | INTRAMUSCULAR | Status: DC | PRN

## 2013-12-25 MED ORDER — FENTANYL CITRATE 0.05 MG/ML IJ SOLN
INTRAMUSCULAR | Status: AC
  Filled 2013-12-25: qty 2

## 2013-12-25 MED ORDER — ALBUTEROL SULFATE (2.5 MG/3ML) 0.083% IN NEBU
2.5000 mg | INHALATION_SOLUTION | Freq: Four times a day (QID) | RESPIRATORY_TRACT | Status: DC
  Administered 2013-12-25 – 2013-12-27 (×7): 2.5 mg via RESPIRATORY_TRACT
  Filled 2013-12-25 (×7): qty 3

## 2013-12-25 MED ORDER — POLYETHYLENE GLYCOL 3350 17 GM/SCOOP PO POWD
1.0000 | Freq: Every day | ORAL | Status: DC
  Filled 2013-12-25: qty 255

## 2013-12-25 MED ORDER — TIOTROPIUM BROMIDE MONOHYDRATE 18 MCG IN CAPS
18.0000 ug | ORAL_CAPSULE | Freq: Every morning | RESPIRATORY_TRACT | Status: DC
  Administered 2013-12-26 – 2013-12-27 (×2): 18 ug via RESPIRATORY_TRACT
  Filled 2013-12-25: qty 5

## 2013-12-25 MED ORDER — CISATRACURIUM BESYLATE 20 MG/10ML IV SOLN
INTRAVENOUS | Status: AC
  Filled 2013-12-25: qty 10

## 2013-12-25 MED ORDER — LACTATED RINGERS IV SOLN
INTRAVENOUS | Status: DC
  Administered 2013-12-25 (×2): via INTRAVENOUS

## 2013-12-25 MED ORDER — PHENYLEPHRINE 40 MCG/ML (10ML) SYRINGE FOR IV PUSH (FOR BLOOD PRESSURE SUPPORT)
PREFILLED_SYRINGE | INTRAVENOUS | Status: AC
  Filled 2013-12-25: qty 10

## 2013-12-25 MED ORDER — ALBUTEROL SULFATE (2.5 MG/3ML) 0.083% IN NEBU: 2.5000 mg | INHALATION_SOLUTION | Freq: Two times a day (BID) | RESPIRATORY_TRACT | Status: DC

## 2013-12-25 MED ORDER — NEOSTIGMINE METHYLSULFATE 10 MG/10ML IV SOLN
INTRAVENOUS | Status: AC
  Filled 2013-12-25: qty 1

## 2013-12-25 MED ORDER — SUFENTANIL CITRATE 50 MCG/ML IV SOLN
INTRAVENOUS | Status: DC | PRN
  Administered 2013-12-25: 5 ug via INTRAVENOUS
  Administered 2013-12-25: 15 ug via INTRAVENOUS

## 2013-12-25 MED ORDER — FENTANYL CITRATE 0.05 MG/ML IJ SOLN
25.0000 ug | INTRAMUSCULAR | Status: DC | PRN
  Administered 2013-12-25 (×3): 50 ug via INTRAVENOUS

## 2013-12-25 MED ORDER — PHENYLEPHRINE HCL 10 MG/ML IJ SOLN
INTRAMUSCULAR | Status: DC | PRN
  Administered 2013-12-25: 40 ug via INTRAVENOUS
  Administered 2013-12-25 (×2): 80 ug via INTRAVENOUS

## 2013-12-25 MED ORDER — METHOCARBAMOL 1000 MG/10ML IJ SOLN
500.0000 mg | Freq: Four times a day (QID) | INTRAVENOUS | Status: DC | PRN
  Administered 2013-12-25 – 2013-12-26 (×2): 500 mg via INTRAVENOUS
  Filled 2013-12-25 (×2): qty 5

## 2013-12-25 MED ORDER — LIDOCAINE HCL (CARDIAC) 20 MG/ML IV SOLN
INTRAVENOUS | Status: AC
  Filled 2013-12-25: qty 5

## 2013-12-25 MED ORDER — DOCUSATE SODIUM 100 MG PO CAPS
100.0000 mg | ORAL_CAPSULE | Freq: Two times a day (BID) | ORAL | Status: DC
  Administered 2013-12-25 – 2013-12-27 (×3): 100 mg via ORAL

## 2013-12-25 MED ORDER — GELATIN ABSORBABLE MT POWD
OROMUCOSAL | Status: DC | PRN
  Administered 2013-12-25: 09:00:00 via TOPICAL

## 2013-12-25 MED ORDER — SODIUM CHLORIDE 0.9 % IR SOLN
Status: DC | PRN
  Administered 2013-12-25: 09:00:00

## 2013-12-25 MED ORDER — PROPOFOL 10 MG/ML IV BOLUS
INTRAVENOUS | Status: AC
  Filled 2013-12-25: qty 20

## 2013-12-25 MED ORDER — TAMSULOSIN HCL 0.4 MG PO CAPS
0.8000 mg | ORAL_CAPSULE | Freq: Every day | ORAL | Status: DC
  Administered 2013-12-25 – 2013-12-26 (×2): 0.8 mg via ORAL
  Filled 2013-12-25 (×3): qty 2

## 2013-12-25 MED ORDER — ACETAMINOPHEN 650 MG RE SUPP: 650.0000 mg | RECTAL | Status: DC | PRN

## 2013-12-25 MED ORDER — DEXAMETHASONE SODIUM PHOSPHATE 10 MG/ML IJ SOLN
INTRAMUSCULAR | Status: AC
  Filled 2013-12-25: qty 1

## 2013-12-25 MED ORDER — ONDANSETRON HCL 4 MG/2ML IJ SOLN
INTRAMUSCULAR | Status: AC
  Filled 2013-12-25: qty 2

## 2013-12-25 MED ORDER — SIMETHICONE 80 MG PO CHEW
80.0000 mg | CHEWABLE_TABLET | Freq: Three times a day (TID) | ORAL | Status: DC
  Administered 2013-12-25 – 2013-12-27 (×3): 80 mg via ORAL
  Filled 2013-12-25 (×11): qty 1

## 2013-12-25 SURGICAL SUPPLY — 46 items
BAG ZIPLOCK 12X15 (MISCELLANEOUS) ×6 IMPLANT
BENZOIN TINCTURE PRP APPL 2/3 (GAUZE/BANDAGES/DRESSINGS) ×2 IMPLANT
CLEANER TIP ELECTROSURG 2X2 (MISCELLANEOUS) ×2 IMPLANT
DRAIN PENROSE 18X1/4 LTX STRL (WOUND CARE) IMPLANT
DRAPE LG THREE QUARTER DISP (DRAPES) ×4 IMPLANT
DRAPE MICROSCOPE LEICA (MISCELLANEOUS) ×2 IMPLANT
DRAPE POUCH INSTRU U-SHP 10X18 (DRAPES) ×2 IMPLANT
DRAPE SURG 17X11 SM STRL (DRAPES) ×2 IMPLANT
DRSG ADAPTIC 3X8 NADH LF (GAUZE/BANDAGES/DRESSINGS) ×2 IMPLANT
DRSG EMULSION OIL 3X16 NADH (GAUZE/BANDAGES/DRESSINGS) ×2 IMPLANT
DRSG PAD ABDOMINAL 8X10 ST (GAUZE/BANDAGES/DRESSINGS) ×4 IMPLANT
DURAPREP 26ML APPLICATOR (WOUND CARE) ×2 IMPLANT
ELECT BLADE TIP CTD 4 INCH (ELECTRODE) ×2 IMPLANT
ELECT REM PT RETURN 9FT ADLT (ELECTROSURGICAL) ×2
ELECTRODE REM PT RTRN 9FT ADLT (ELECTROSURGICAL) ×1 IMPLANT
GAUZE SPONGE 4X4 16PLY XRAY LF (GAUZE/BANDAGES/DRESSINGS) ×2 IMPLANT
GLOVE BIOGEL PI IND STRL 6.5 (GLOVE) ×1 IMPLANT
GLOVE BIOGEL PI IND STRL 8 (GLOVE) ×2 IMPLANT
GLOVE BIOGEL PI INDICATOR 6.5 (GLOVE) ×1
GLOVE BIOGEL PI INDICATOR 8 (GLOVE) ×2
GLOVE ECLIPSE 8.0 STRL XLNG CF (GLOVE) ×8 IMPLANT
GLOVE SURG SS PI 6.5 STRL IVOR (GLOVE) ×6 IMPLANT
GLOVE SURG SS PI 8.0 STRL IVOR (GLOVE) ×2 IMPLANT
GOWN STRL REUS W/TWL XL LVL3 (GOWN DISPOSABLE) ×8 IMPLANT
KIT BASIN OR (CUSTOM PROCEDURE TRAY) ×2 IMPLANT
KIT POSITIONING SURG ANDREWS (MISCELLANEOUS) ×2 IMPLANT
MANIFOLD NEPTUNE II (INSTRUMENTS) ×2 IMPLANT
NEEDLE SPNL 18GX3.5 QUINCKE PK (NEEDLE) ×6 IMPLANT
NS IRRIG 1000ML POUR BTL (IV SOLUTION) IMPLANT
PAD ABD 8X10 STRL (GAUZE/BANDAGES/DRESSINGS) ×2 IMPLANT
PATTIES SURGICAL .5 X.5 (GAUZE/BANDAGES/DRESSINGS) IMPLANT
PATTIES SURGICAL .75X.75 (GAUZE/BANDAGES/DRESSINGS) IMPLANT
PATTIES SURGICAL 1X1 (DISPOSABLE) IMPLANT
PIN SAFETY NICK PLATE  2 MED (MISCELLANEOUS)
PIN SAFETY NICK PLATE 2 MED (MISCELLANEOUS) IMPLANT
POSITIONER SURGICAL ARM (MISCELLANEOUS) ×2 IMPLANT
SPONGE LAP 4X18 X RAY DECT (DISPOSABLE) ×4 IMPLANT
SPONGE SURGIFOAM ABS GEL 100 (HEMOSTASIS) ×4 IMPLANT
STAPLER VISISTAT 35W (STAPLE) ×2 IMPLANT
SUT VIC AB 0 CT1 27 (SUTURE) ×1
SUT VIC AB 0 CT1 27XBRD ANTBC (SUTURE) ×1 IMPLANT
SUT VIC AB 1 CT1 27 (SUTURE) ×3
SUT VIC AB 1 CT1 27XBRD ANTBC (SUTURE) ×3 IMPLANT
TAPE CLOTH SURG 6X10 WHT LF (GAUZE/BANDAGES/DRESSINGS) ×2 IMPLANT
TOWEL OR 17X26 10 PK STRL BLUE (TOWEL DISPOSABLE) ×2 IMPLANT
TRAY LAMINECTOMY (CUSTOM PROCEDURE TRAY) ×2 IMPLANT

## 2013-12-25 NOTE — Op Note (Signed)
NAMEMARSHELL, DILAURO NO.:  [id number]  MEDICAL RECORD NO.:  [id number]  LOCATION:  10                         FACILITY:  [facility]  PHYSICIAN:  Kipp Brood. Aime Carreras, M.D.DATE OF BIRTH:  July 26, 1955  DATE OF PROCEDURE:  12/25/2013 DATE OF DISCHARGE:                              OPERATIVE REPORT   SURGEON:  Kipp Brood. Gladstone Lighter, MD  ASSISTANT:  Tarri Glenn, MD  PREOPERATIVE DIAGNOSES: 1. 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  We went     according to the myelogram and the plain films in order to localize     the exact level.  Great care was taken during the procedure to make     sure we are at the right level. 2. At this point, the second diagnosis is foot drop on the right. 3. 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  POSTOPERATIVE DIAGNOSES: 1. 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 We went     according to the myelogram and the plain films in order to localize     the exact level. Great care was taken during the procedure to make     sure we are at the right level. 2. At this point, the second diagnosis is foot drop on the right. 3. 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  OPERATION: 1. Decompression of the lateral recess for severe spinal stenosis on     the right. 2. Foraminotomies for both roots on the right at L5-S1, root above and     root distal.  This was for foraminal stenosis. 3. Microdiscectomy at L5-S1 on the right.  The specimen was sent.  DESCRIPTION OF PROCEDURE:  Under general anesthesia, routine orthopedic prep and draping of the back was carried out.  I also called the appropriate time-out first after the prep, and then I also marked the appropriate right side in the holding area.  At this time, 2 needles were placed in the back for localization  purposes.  X-ray was taken. Incision then was made over the L5-S1 interspace.  Note once again, he had six lumbar vertebrae, and the last open space was labeled S1-S2. The space to be noted that we had operated on was at L5 and S1.  At this time, an incision was made.  Bleeders were identified and cauterized.  I then separated the muscle from the lamina and spinous process bilaterally.  The self-retaining retractors were inserted.  Another x- ray was taken.  Following that, we then went down and did our hemilaminectomy at L5-S1 on the right.  We had to go far out laterally and decompressed the recess for recess stenosis, we had to go up in the foramina above and below to do foraminotomies for the foraminal stenosis.  At this time, the microscope was brought in.  We gently removed the ligamentum flavum from the dura.  Cottonoids were placed in for retraction to help with the retraction.  We  identified the space and another x-ray was taken with the needle in place.  Note, initially the radiologist mislabeled the L5 vertebrae, he labeled it at L4.  I did call, spoke with him, and recorrected tha.  The L5 vertebrae was the one that had the compression fractures.  So there was no question during the procedure that we were at L5-S1.  At this point, we then did our decompressive lumbar laminectomy, decompressed the recess as I mentioned, and I cauterized lateral recess veins.  The microscope was used.  We then went down, made a cruciate incision in the posterior longitudinal ligament and started our diskectomy.  I utilized the nerve hook and the Epstein curettes to go distally, medially, proximally, and far out laterally to decompress the disk into the space.  When we completed diskectomy, we had a very easily movable root on that side distally, and above we did a foraminotomy and we opened the foramen distally.  Though the dura in root now were completely free, we were able to easily pass a  hockey-stick into the foramina above the space as well as below.  Thoroughly irrigated out the area, loosely applied some thrombin-soaked Gelfoam.  Closed the wound layers in usual fashion.  I left a distal and proximal deep part of the wound open partially for drainage purposes.  I then at the end of the procedure injected 20 mL of Exparel.  Note, at very beginning of the procedure, I injected 20 mL of 0.25% Marcaine with epinephrine and soft tissue to control the bleeding. The wound as I mentioned then was closed.  Sterile dressings were applied.  I did give had 2 g of IV Ancef preop, now great care was taken during the procedure because of his hepatitis situation.          ______________________________ Kipp Brood Gladstone Lighter, M.D.     RAG/MEDQ  D:  12/25/2013  T:  12/25/2013  Job:  [id number]

## 2013-12-25 NOTE — Brief Op Note (Signed)
12/25/2013  10:35 AM  PATIENT:  Robert Gould  58 y.o. 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  POST-OPERATIVE DIAGNOSIS: Same as Pre-Op  PROCEDURE:  Decompressive Laminectomy at L-5-S-1 on the right for SEVERE Recess Stenosis and TWO LEVEL Foraminotomies For Foraminal Stenosis on The Right.Microdiscectomy at L-5-S-1 on the Right for a Foraminal Disc Herniation. SURGEON:  Surgeon(s) and Role:    * Tobi Bastos, MD - Primary    * Magnus Sinning, MD - Assisting    ASSISTANTS: Tarri Glenn MD   ANESTHESIA:   general  EBL:  Total I/O In: 1000 [I.V.:1000] Out: 75 [Blood:75]  BLOOD ADMINISTERED:none  DRAINS: none   LOCAL MEDICATIONS USED:  MARCAINE 20cc of 0.25% with Epinephrine at [facility] of case and 20cc of Exparel at end of case.    SPECIMEN:  Source of Specimen:  L-5-S-1  DISPOSITION OF SPECIMEN:  PATHOLOGY  COUNTS:  YES  TOURNIQUET:  * No tourniquets in log *  DICTATION: .Other Dictation: Dictation Number  [id number]  PLAN OF CARE: Admit for overnight observation  PATIENT DISPOSITION:  PACU - hemodynamically stable.   Delay start of Pharmacological VTE agent (>24hrs) due to surgical blood loss or risk of bleeding: yes

## 2013-12-25 NOTE — Transfer of Care (Signed)
Immediate Anesthesia Transfer of Care Note  Patient: Robert Gould  Procedure(s) Performed: Procedure(s): HEMI-MICRODISCECTOMY LUMBAR LAMINECTOMY L5 - S1 ON THE RIGHT/FORAMINOTOMY L5 ON THE RIGHT LEVEL 1 (Right)  Patient Location: PACU  Anesthesia Type:General  Level of Consciousness: awake, alert  and oriented  Airway & Oxygen Therapy: Patient Spontanous Breathing and Patient connected to face mask oxygen  Post-op Assessment: Report given to PACU RN and Post -op Vital signs reviewed and stable  Post vital signs: Reviewed and stable  Complications: No apparent anesthesia complications

## 2013-12-25 NOTE — Anesthesia Postprocedure Evaluation (Signed)
  Anesthesia Post-op Note  Patient: Robert Gould  Procedure(s) Performed: Procedure(s) (LRB): HEMI-MICRODISCECTOMY LUMBAR LAMINECTOMY L5 - S1 ON THE RIGHT/FORAMINOTOMY L5 ON THE RIGHT LEVEL 1 (Right)  Patient Location: PACU  Anesthesia Type: General  Level of Consciousness: awake and alert   Airway and Oxygen Therapy: Patient Spontanous Breathing  Post-op Pain: mild  Post-op Assessment: Post-op Vital signs reviewed, Patient's Cardiovascular Status Stable, Respiratory Function Stable, Patent Airway and No signs of Nausea or vomiting  Last Vitals:  Filed Vitals:   12/25/13 1100  BP: 114/73  Pulse: 55  Temp:   Resp: 13    Post-op Vital Signs: stable   Complications: No apparent anesthesia complications

## 2013-12-25 NOTE — Anesthesia Procedure Notes (Signed)
Procedure Name: Intubation Date/Time: 12/25/2013 8:30 AM Performed by: Danley Danker L Patient Re-evaluated:Patient Re-evaluated prior to inductionOxygen Delivery Method: Circle system utilized Preoxygenation: Pre-oxygenation with 100% oxygen Intubation Type: IV induction Ventilation: Mask ventilation without difficulty and Oral airway inserted - appropriate to patient size Laryngoscope Size: Miller and 3 Grade View: Grade I Tube type: Oral Tube size: 8.0 mm Airway Equipment and Method: Stylet Placement Confirmation: breath sounds checked- equal and bilateral,  ETT inserted through vocal cords under direct vision and positive ETCO2 Secured at: 21 cm Tube secured with: Tape Dental Injury: Teeth and Oropharynx as per pre-operative assessment

## 2013-12-25 NOTE — Interval H&P Note (Signed)
History and Physical Interval Note:  12/25/2013 8:20 AM  Robert Gould  has presented today for surgery, with the diagnosis of lumbar HNP L5 - S1 on the right  The various methods of treatment have been discussed with the patient and family. After consideration of risks, benefits and other options for treatment, the patient has consented to  Procedure(s): HEMI-MICRODISCECTOMY LUMBAR LAMINECTOMY L5 - S1 ON THE RIGHT/FORAMINOTOMY L5 ON THE RIGHT LEVEL 1 (Right) as a surgical intervention .  The patient's history has been reviewed, patient examined, no change in status, stable for surgery.  I have reviewed the patient's chart and labs.  Questions were answered to the patient's satisfaction.     [name] A

## 2013-12-25 NOTE — Progress Notes (Signed)
Utilization review completed.  

## 2013-12-26 ENCOUNTER — Encounter (HOSPITAL_COMMUNITY): Payer: Self-pay | Admitting: Orthopedic Surgery

## 2013-12-26 MED ORDER — PROMETHAZINE HCL 25 MG PO TABS
12.5000 mg | ORAL_TABLET | Freq: Four times a day (QID) | ORAL | Status: DC | PRN
  Administered 2013-12-27: 25 mg via ORAL
  Filled 2013-12-26: qty 1

## 2013-12-26 MED ORDER — ONDANSETRON HCL 4 MG PO TABS: 4.0000 mg | ORAL_TABLET | Freq: Three times a day (TID) | ORAL | Status: DC | PRN

## 2013-12-26 MED ORDER — PROMETHAZINE HCL 25 MG/ML IJ SOLN
12.5000 mg | Freq: Four times a day (QID) | INTRAMUSCULAR | Status: DC | PRN
  Administered 2013-12-26: 25 mg via INTRAVENOUS
  Administered 2013-12-26: 12.5 mg via INTRAVENOUS
  Filled 2013-12-26 (×2): qty 1

## 2013-12-26 MED ORDER — HYDROMORPHONE HCL 2 MG PO TABS
2.0000 mg | ORAL_TABLET | ORAL | Status: DC | PRN
  Administered 2013-12-26 (×2): 2 mg via ORAL
  Administered 2013-12-27 (×2): 4 mg via ORAL
  Filled 2013-12-26: qty 2
  Filled 2013-12-26: qty 1
  Filled 2013-12-26: qty 2
  Filled 2013-12-26 (×2): qty 1
  Filled 2013-12-26: qty 2

## 2013-12-26 MED ORDER — PROMETHAZINE HCL 25 MG/ML IJ SOLN
12.5000 mg | Freq: Four times a day (QID) | INTRAMUSCULAR | Status: DC | PRN
  Administered 2013-12-26: 12.5 mg via INTRAVENOUS
  Filled 2013-12-26: qty 1

## 2013-12-26 MED ORDER — METHOCARBAMOL 500 MG PO TABS: 500.0000 mg | ORAL_TABLET | Freq: Four times a day (QID) | ORAL | Status: DC | PRN

## 2013-12-26 MED ORDER — PROMETHAZINE HCL 25 MG PO TABS: 12.5000 mg | ORAL_TABLET | Freq: Four times a day (QID) | ORAL | Status: DC | PRN

## 2013-12-26 MED ORDER — HYDROMORPHONE HCL 2 MG PO TABS: 2.0000 mg | ORAL_TABLET | ORAL | Status: DC | PRN

## 2013-12-26 NOTE — Progress Notes (Signed)
OT Cancellation Note  Patient Details Name: Robert Gould MRN: [id number] DOB: 07-03-1955   Cancelled Treatment:    Reason Eval/Treat Not Completed: Other (comment).  Pt was not feeling well this am. If pt feels better this pm, we will return.  [name] 12/26/2013, 12:05 PM Lesle Chris, OTR/L [phone] 12/26/2013

## 2013-12-26 NOTE — Progress Notes (Signed)
12/26/13 Nursing 0120 Amber Cecilio Asper called reg oxy pain tabs not controlling pain and causing nausea. Order received for dilaudid po tabs.

## 2013-12-26 NOTE — Progress Notes (Signed)
Subjective: 1 Day Post-Op Procedure(s) (LRB): HEMI-MICRODISCECTOMY LUMBAR LAMINECTOMY L5 - S1 ON THE RIGHT/FORAMINOTOMY L5 ON THE RIGHT LEVEL 1 (Right) Patient reports pain as 3 on 0-10 scale.Dorsiflexion of right foot now markedly improved.Will Dc later if improved.    Objective: Vital signs in last 24 hours: Temp:  [98.3 F (36.8 C)-99.1 F (37.3 C)] 98.6 F (37 C) (09/10 0540) Pulse Rate:  [55-70] 55 (09/10 0540) Resp:  [12-20] 16 (09/10 0540) BP: (106-165)/(56-78) 118/70 mmHg (09/10 0540) SpO2:  [94 %-100 %] 96 % (09/10 0540) Arterial Line BP: (115-116)/(64) 116/64 mmHg (09/09 1100)  Intake/Output from previous day: 09/09 0701 - 09/10 0700 In: 3295 [I.V.:3095; IV Piggyback:200] Out: 2375 [Urine:2300; Blood:75] Intake/Output this shift:    No results found for this basename: HGB,  in the last 72 hours No results found for this basename: WBC, RBC, HCT, PLT,  in the last 72 hours No results found for this basename: NA, K, CL, CO2, BUN, CREATININE, GLUCOSE, CALCIUM,  in the last 72 hours No results found for this basename: LABPT, INR,  in the last 72 hours  Dorsiflexion/Plantar flexion intact  Assessment/Plan: 1 Day Post-Op Procedure(s) (LRB): HEMI-MICRODISCECTOMY LUMBAR LAMINECTOMY L5 - S1 ON THE RIGHT/FORAMINOTOMY L5 ON THE RIGHT LEVEL 1 (Right) Up with therapy.Will see how his pain level is prior to making a decision to DC  Robert Gould A 12/26/2013, 7:05 AM

## 2013-12-26 NOTE — Discharge Instructions (Addendum)
Walk with your walker. Weight bearing as tolerated Change your dressing daily. Shower only, no tub bath. Call if any temperatures greater than 101 or any wound complications: [phone] during the day and ask for Dr. Charlestine Night nurse, Brunilda Payor. All prescriptions written except Prednisone which has been sent electronically to your pharmacy

## 2013-12-26 NOTE — Evaluation (Signed)
Physical Therapy Evaluation Patient Details Name: Robert Gould MRN: [id number] DOB: 1956/03/31 Today's Date: 12/26/2013   History of Present Illness     Clinical Impression  Pt s/p L5-S1 laminectomy and hemi-microdiscectomy presents with functional mobility limitations 2* post op back pain and back precautions.  Pt should progress to d/c home with family assist.    Follow Up Recommendations No PT follow up    Equipment Recommendations  Rolling walker with 5" wheels    Recommendations for Other Services OT consult     Precautions / Restrictions Precautions Precautions: Back Precaution Booklet Issued: Yes (comment) Precaution Comments: Precautions reviewed and handout provided Restrictions Weight Bearing Restrictions: No      Mobility  Bed Mobility Overal bed mobility: Needs Assistance Bed Mobility: Supine to Sit;Sit to Supine     Supine to sit: Min assist Sit to supine: Min assist   General bed mobility comments: cues for correct log roll technique and adherence to back precautions  Transfers Overall transfer level: Needs assistance Equipment used: Rolling walker (2 wheeled) Transfers: Sit to/from Stand Sit to Stand: Min assist         General transfer comment: cues for transition position, use of UEs to assist and adherence to back precautions  Ambulation/Gait Ambulation/Gait assistance: Min assist;Min guard Ambulation Distance (Feet): 140 Feet (and 14' to bathroom) Assistive device: Rolling walker (2 wheeled) Gait Pattern/deviations: Step-through pattern;Decreased step length - right;Decreased step length - left;Shuffle;Trunk flexed     General Gait Details: cues for posture and position from [employer]            Wheelchair Mobility    Modified Rankin (Stroke Patients Only)       Balance                                             Pertinent Vitals/Pain Pain Assessment: 0-10 Pain Score: 7  Pain Location: R hip/thigh  and back Pain Descriptors / Indicators: Aching;Sore Pain Intervention(s): Limited activity within patient's tolerance;Monitored during session;Premedicated before session    [place] expects to be discharged to:: Private residence Living Arrangements: Other relatives Available Help at Discharge: Family Type of Home: House Home Access: Stairs to enter Entrance Stairs-Rails: None Entrance Stairs-Number of Steps: 3   Home Equipment: Cane - single point      Prior Function Level of Independence: Independent;Independent with assistive device(s)         Comments: used cane as needed     Hand Dominance        Extremity/Trunk Assessment   Upper Extremity Assessment: Overall WFL for tasks assessed           Lower Extremity Assessment: Overall WFL for tasks assessed         Communication   Communication: No difficulties  Cognition Arousal/Alertness: Awake/alert Behavior During Therapy: WFL for tasks assessed/performed Overall Cognitive Status: Within Functional Limits for tasks assessed                      General Comments      Exercises        Assessment/Plan    PT Assessment Patient needs continued PT services  PT Diagnosis Difficulty walking   PT Problem List Decreased strength;Decreased activity tolerance;Decreased mobility;Decreased knowledge of use of DME;Decreased knowledge of precautions;Pain  PT Treatment Interventions DME instruction;Gait training;Stair training;Functional mobility training;Therapeutic activities;Therapeutic  exercise;Patient/family education   PT Goals (Current goals can be found in the Care Plan section) Acute Rehab PT Goals Patient Stated Goal: Resume previous lifestyle with decreased pain PT Goal Formulation: With patient Time For Goal Achievement: 01/02/14 Potential to Achieve Goals: Good    Frequency 7X/week   Barriers to discharge        Co-evaluation               End of Session  Equipment Utilized During Treatment: Oxygen Activity Tolerance: Patient tolerated treatment well;Patient limited by pain Patient left: in bed;with call bell/phone within reach;with family/visitor present Nurse Communication: Mobility status         Time: 1527-1610 PT Time Calculation (min): 43 min   Charges:   PT Evaluation $Initial PT Evaluation Tier I: 1 Procedure PT Treatments $Gait Training: 8-22 mins $Therapeutic Activity: 8-22 mins   PT G Codes:          Robert Gould 12/26/2013, 5:37 PM

## 2013-12-27 MED ORDER — OXYCODONE-ACETAMINOPHEN 10-325 MG PO TABS: 1.0000 | ORAL_TABLET | ORAL | Status: AC | PRN

## 2013-12-27 MED ORDER — POLYETHYLENE GLYCOL 3350 17 G PO PACK
17.0000 g | PACK | Freq: Every day | ORAL | Status: DC
  Administered 2013-12-27: 17 g via ORAL

## 2013-12-27 MED ORDER — PROMETHAZINE HCL 12.5 MG PO TABS: 12.5000 mg | ORAL_TABLET | Freq: Four times a day (QID) | ORAL | Status: AC | PRN

## 2013-12-27 MED ORDER — METHOCARBAMOL 500 MG PO TABS: 500.0000 mg | ORAL_TABLET | Freq: Four times a day (QID) | ORAL | Status: AC | PRN

## 2013-12-27 MED ORDER — GABAPENTIN 300 MG PO CAPS: 300.0000 mg | ORAL_CAPSULE | Freq: Three times a day (TID) | ORAL | Status: AC

## 2013-12-27 NOTE — Progress Notes (Signed)
Subjective: 2 Days Post-Op Procedure(s) (LRB): HEMI-MICRODISCECTOMY LUMBAR LAMINECTOMY L5 - S1 ON THE RIGHT/FORAMINOTOMY L5 ON THE RIGHT LEVEL 1 (Right) Patient reports pain as 3 on 0-10 scale.Pain is over his Greater Trochanter. Dorsiflexors of Foot is now normal. Will DC and see Monday.    Objective: Vital signs in last 24 hours: Temp:  [98.1 F (36.7 C)-99.2 F (37.3 C)] 98.7 F (37.1 C) (09/11 0545) Pulse Rate:  [55-60] 55 (09/11 0545) Resp:  [12-18] 18 (09/11 0545) BP: (111-164)/(53-65) 122/62 mmHg (09/11 0545) SpO2:  [88 %-98 %] 92 % (09/11 0545)  Intake/Output from previous day: 09/10 0701 - 09/11 0700 In: 2348.3 [P.O.:360; I.V.:1988.3] Out: 2275 [Urine:2225; Emesis/NG output:50] Intake/Output this shift:    No results found for this basename: HGB,  in the last 72 hours No results found for this basename: WBC, RBC, HCT, PLT,  in the last 72 hours No results found for this basename: NA, K, CL, CO2, BUN, CREATININE, GLUCOSE, CALCIUM,  in the last 72 hours No results found for this basename: LABPT, INR,  in the last 72 hours  Neurologically intact  Assessment/Plan: 2 Days Post-Op Procedure(s) (LRB): HEMI-MICRODISCECTOMY LUMBAR LAMINECTOMY L5 - S1 ON THE RIGHT/FORAMINOTOMY L5 ON THE RIGHT LEVEL 1 (Right) Discharge home with home health  Robert Gould A 12/27/2013, 7:08 AM

## 2013-12-27 NOTE — Plan of Care (Signed)
Problem: Consults Goal: Diagnosis - Spinal Surgery Outcome: Completed/Met Date Met:  12/27/13 Lumbar Laminectomy (Complex) L5-S1  Problem: Phase II Progression Outcomes Goal: Tolerating diet Outcome: Not Met (add Reason) N/V, diet changed to CLEARS

## 2013-12-27 NOTE — Progress Notes (Signed)
[facility]   [facility] is providing the following services: RW  If patient discharges after hours, please call [phone].   Robert Gould 12/27/2013, 10:17 AM

## 2013-12-27 NOTE — Progress Notes (Signed)
Physical Therapy Treatment Patient Details Name: Robert Gould MRN: [id number] DOB: Oct 26, 1955 Today's Date: 12/27/2013    History of Present Illness Decompression of the lateral recess for severe spinal stenosis on right, foraminotomies and microdiscectomies for L5-S1., R foot drop  PTA    PT Comments    Patient  Expressing that the pain in his R hip is worse, feels he cannot go home in his current condition. Reports only his elderly brother is home and unable to assist patient. Recommend HHPT. Discussed SNF rehab but patient declined/ will see a secons   Follow Up Recommendations  Home health PT;Supervision/Assistance - 24 hour     Equipment Recommendations  Rolling walker with 5" wheels;3in1 (PT)    Recommendations for Other Services       Precautions / Restrictions Precautions Precautions: Back Precaution Comments: Precautions reviewed and handout provided Restrictions Weight Bearing Restrictions: No    Mobility  Bed Mobility Overal bed mobility: Needs Assistance Bed Mobility: Rolling;Sidelying to Sit Rolling: Min assist Sidelying to sit: Min assist       General bed mobility comments: cues for log roll and some assist for sidelying to sit.  Transfers Overall transfer level: Needs assistance Equipment used: Rolling walker (2 wheeled) Transfers: Sit to/from Stand Sit to Stand: Min assist         General transfer comment: vebal cues to stay more erect for back precautions, hand placement.  Ambulation/Gait Ambulation/Gait assistance: Min assist Ambulation Distance (Feet): 100 Feet Assistive device: Rolling walker (2 wheeled) Gait Pattern/deviations: Step-to pattern;Step-through pattern;Antalgic;Decreased step length - right;Decreased stance time - right     General Gait Details: cues for posture and position from [employer]            Wheelchair Mobility    Modified Rankin (Stroke Patients Only)       Balance                                     Cognition Arousal/Alertness: Awake/alert Behavior During Therapy: WFL for tasks assessed/performed Overall Cognitive Status: Within Functional Limits for tasks assessed                      Exercises      General Comments        Pertinent Vitals/Pain Pain Assessment: 0-10 Pain Score: 10-Worst pain ever Pain Location: R hip/thigh  Pain Descriptors / Indicators: Constant Pain Intervention(s): Repositioned;Monitored during session    Home Living Family/patient expects to be discharged to:: Private residence Living Arrangements: Other relatives Available Help at Discharge: Family Type of Home: House Home Access: Stairs to enter Entrance Stairs-Rails: None   Home Equipment: Kasandra Knudsen - single point      Prior Function Level of Independence: Independent;Independent with assistive device(s);Needs assistance    ADL's / Homemaking Assistance Needed: pt states brother has had to help with meals because he has had so much back pain. Comments: used cane as needed   PT Goals (current goals can now be found in the care plan section) Acute Rehab PT Goals Patient Stated Goal: decrease pain Progress towards PT goals: Progressing toward goals    Frequency  7X/week    PT Plan Current plan remains appropriate;Equipment recommendations need to be updated    Co-evaluation             End of Session   Activity Tolerance: Patient limited by pain Patient left:  (  with OT)     Time: 9390-3009 PT Time Calculation (min): 23 min  Charges:  $Gait Training: 23-37 mins                    G Codes:      Robert Gould 12/27/2013, 12:28 PM Robert Gould PT [phone]

## 2013-12-27 NOTE — Evaluation (Signed)
Occupational Therapy Evaluation Patient Details Name: Robert Gould MRN: [id number] DOB: 26-Apr-1955 Today's Date: 12/27/2013    History of Present Illness Decompression of the lateral recess for severe spinal stenosis on right, foraminotomies and microdiscectomies for L5-S1., R foot drop  PTA   Clinical Impression   Pt limited by significant pain during functional tasks this visit. He is not ready for d/c from OT standpoint. He will benefit from [facility], aide at home as he has limited assist at home. Will follow on acute to progress ADL independence.     Follow Up Recommendations  Home health OT;Other (comment);Supervision/Assistance - 24 hour ([place] aide)    Equipment Recommendations  3 in 1 bedside comode    Recommendations for Other Services       Precautions / Restrictions Precautions Precautions: Back Precaution Comments: Precautions reviewed and handout provided Restrictions Weight Bearing Restrictions: No      Mobility Bed Mobility Overal bed mobility: Needs Assistance Bed Mobility: Rolling;Sidelying to Sit Rolling: Min assist Sidelying to sit: Min assist       General bed mobility comments: cues for log roll and some assist for sidelying to sit.  Transfers Overall transfer level: Needs assistance Equipment used: Rolling walker (2 wheeled) Transfers: Sit to/from Stand Sit to Stand: Min assist         General transfer comment: vebal cues to stay more erect for back precautions, hand placement.    Balance                                            ADL Overall ADL's : Needs assistance/impaired Eating/Feeding: Independent;Sitting   Grooming: Wash/dry hands;Set up;Sitting   Upper Body Bathing: Set up;Sitting;Supervision/ safety   Lower Body Bathing: Moderate assistance;Sit to/from stand   Upper Body Dressing : Minimal assistance;Sitting   Lower Body Dressing: Moderate assistance;Sit to/from stand   Toilet Transfer: Minimal  assistance;Ambulation;BSC;RW   Toileting- Clothing Manipulation and Hygiene: Minimal assistance;Sit to/from stand         General ADL Comments: Pt limited by constant pain during session but pt does state his R leg/thigh felt better with walking but then pain increased once in chair sitting. Educated pt on AE options for LB self care as pt not able to cross LEs up to don socks thiis session. Assisted pt into bathroom for 3in1 transfer and he needed mod cues for back precautions as he tended to bend to sit down and tried to reach for Pj bottoms that were dropping to floor. Advised pt to wear elastic pants with stronger elastic to help grip to thighs and not fall all the way to ankles. Discussed SNF short term but pt declines rehab but does agree to [facility] therapy. Educated on back precautions and reviewed handout. Pt not ready for d/c home today.     Vision                     Perception     Praxis      Pertinent Vitals/Pain Pain Assessment: 0-10 Pain Score: 10-Worst pain ever Pain Location: R hip/thigh  Pain Descriptors / Indicators: Constant Pain Intervention(s): Repositioned;Monitored during session     Hand Dominance     Extremity/Trunk Assessment Upper Extremity Assessment Upper Extremity Assessment: Overall WFL for tasks assessed           Communication Communication Communication: No difficulties   Cognition  Arousal/Alertness: Awake/alert Behavior During Therapy: WFL for tasks assessed/performed Overall Cognitive Status: Within Functional Limits for tasks assessed                     General Comments       Exercises       Shoulder Instructions      Home Living Family/patient expects to be discharged to:: Private residence Living Arrangements: Other relatives Available Help at Discharge: Family Type of Home: House Home Access: Stairs to enter Technical brewer of Steps: 3 Entrance Stairs-Rails: None       Bathroom Shower/Tub: Emergency planning/management officer: Standard     Home Equipment: Cane - single point          Prior Functioning/Environment Level of Independence: Independent;Independent with assistive device(s);Needs assistance    ADL's / Homemaking Assistance Needed: pt states brother has had to help with meals because he has had so much back pain.   Comments: used cane as needed    OT Diagnosis: Acute pain;Generalized weakness   OT Problem List: Decreased strength;Decreased knowledge of use of DME or AE;Decreased knowledge of precautions;Pain   OT Treatment/Interventions: Self-care/ADL training;Patient/family education;Therapeutic activities;DME and/or AE instruction    OT Goals(Current goals can be found in the care plan section) Acute Rehab OT Goals Patient Stated Goal: decrease pain OT Goal Formulation: With patient Time For Goal Achievement: 01/03/14 Potential to Achieve Goals: Good  OT Frequency: Min 2X/week   Barriers to D/C:            Co-evaluation              End of Session Equipment Utilized During Treatment: Rolling walker  Activity Tolerance: Patient limited by pain Patient left: in chair;with call bell/phone within reach   Time: 0946-1012 OT Time Calculation (min): 26 min Charges:  OT General Charges $OT Visit: 1 Procedure OT Evaluation $Initial OT Evaluation Tier I: 1 Procedure OT Treatments $Self Care/Home Management : 8-22 mins G-Codes:    Jules Schick [phone] 12/27/2013, 11:40 AM

## 2013-12-27 NOTE — Progress Notes (Signed)
Physical Therapy Treatment Patient Details Name: Isaia Hassell MRN: [id number] DOB: 1955/09/22 Today's Date: 12/27/2013    History of Present Illness Decompression of the lateral recess for severe spinal stenosis on right, foraminotomies and microdiscectomies for L5-S1., R foot drop  PTA    PT Comments    Patient is mobilizing better but did have IV pain meds. Patient now wants to DC home today. RN notified. To get HHPT   Follow Up Recommendations  Home health PT;Supervision/Assistance - 24 hour     Equipment Recommendations  Rolling walker with 5" wheels;3in1 (PT)    Recommendations for Other Services       Precautions / Restrictions Precautions Precautions: Back Precaution Comments: Precautions reviewed and handout provided    Mobility  Bed Mobility Overal bed mobility: Modified Independent                Transfers Overall transfer level: Modified independent Equipment used: Rolling walker (2 wheeled) Transfers: Sit to/from Stand Sit to Stand: Modified independent (Device/Increase time)         General transfer comment: vebal cues to stay more erect for back precautions, hand placement.  Ambulation/Gait Ambulation/Gait assistance: Supervision Ambulation Distance (Feet): 30 Feet Assistive device: Rolling walker (2 wheeled) Gait Pattern/deviations: Step-through pattern;Antalgic     General Gait Details: cues for posture and position from RW   Stairs Stairs: Yes Stairs assistance: Min assist Stair Management: No rails;Alternating pattern;Step to pattern;Backwards;Forwards;With walker Number of Stairs: 2 General stair comments: practiced backward with RW and forward with HH.  Wheelchair Mobility    Modified Rankin (Stroke Patients Only)       Balance                                    Cognition                            Exercises      General Comments        Pertinent Vitals/Pain Pain Score: 5  Pain  Location: R hip and thigh, had iv med. Pain Descriptors / Indicators: Constant Pain Intervention(s): Premedicated before session    Home Living                      Prior Function            PT Goals (current goals can now be found in the care plan section) Acute Rehab PT Goals Patient Stated Goal: decrease pain Progress towards PT goals: Progressing toward goals    Frequency  7X/week    PT Plan Current plan remains appropriate;Equipment recommendations need to be updated    Co-evaluation             End of Session   Activity Tolerance: Patient tolerated treatment well Patient left: in bed     Time: 1510-1523 PT Time Calculation (min): 13 min  Charges:  $Gait Training: 8-22 mins                    G Codes:      Claretha Cooper 12/27/2013, 3:38 PM Tresa Endo PT [phone]

## 2013-12-27 NOTE — Progress Notes (Signed)
RN helped patient with CPAP. RT added water to chamber for humidification when treatment was given. RT will monitor as needed.

## 2014-01-06 NOTE — Discharge Summary (Signed)
Physician Discharge Summary   Patient ID: Robert Gould MRN: [id number] DOB/AGE: [birth year] 60 y.o.  Admit date: 12/25/2013 Discharge date: 12/27/2013  Primary Diagnosis:  Lumbar spinal stenosis  Admission Diagnoses:  Past Medical History  Diagnosis Date  . Keratosis   . Anxiety   . Atrial fibrillation   . BPH (benign prostatic hypertrophy)   . Cardiomyopathy   . Constipation   . Cirrhosis   . Esophageal reflux   . Hypertension   . Hypoxia     uses oxygen with exertion  . Pulmonary nodules   . Liver lesion, right lobe     right lower lobe  . Lumbar herniated disc   . Muscle atrophy   . Nodule on liver   . Obesity   . Peripheral neuropathy   . Restless leg syndrome   . Sleep apnea     severe uses cpap  . 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 Asthma   . Benign neoplasm of large intestine   . History of home oxygen therapy     USES 2 1/2 LITERS PER MINUTE PRN  . Cough LAST FEW YEARS    OCCASIONALLY PASSED OUT WITH COUGHING  . Hepatitis YRS AGO    hepatitis c, TOOK TREATMENT FOR 15 YRS AGO, NONE DETEACTABLE IN BLOOD NOW PER PT   Discharge Diagnoses:   Active Problems:   Spinal stenosis, lumbar region, with neurogenic claudication   Herniated lumbar intervertebral disc   Spinal stenosis of lumbar region with neurogenic claudication  Estimated body mass index is 25.53 kg/(m^2) as calculated from the following:   Height as of this encounter: $RemoveBeforeD'5\' 11"'[id number]$  (1.803 m).   Weight as of this encounter: 83.008 kg (183 lb).  Procedure:  Procedure(s) (LRB): HEMI-MICRODISCECTOMY LUMBAR LAMINECTOMY L5 - S1 ON THE RIGHT/FORAMINOTOMY L5 ON THE RIGHT LEVEL 1 (Right)   Consults: None  HPI: Robert Gould is a 58 year old male who presented with the chief complaint of low back pain. He has been dealing with this for about 3 months with no known injury. His chief complaint is pain in the right gluteal area radiating down his right thigh to the right knee. His pain goes all the way down  his leg to his ankle. He said he can walk five minutes and he has to stop because of his leg pain. When he stops the pain goes away. He has no groin pain. CT showed disc herniation at L5-S1 on the right.     Laboratory Data: Hospital Outpatient Visit on 12/19/2013  Component Date Value Ref Range Status  . WBC 12/19/2013 8.5  4.0 - 10.5 K/uL Final  . RBC 12/19/2013 4.83  4.22 - 5.81 MIL/uL Final  . Hemoglobin 12/19/2013 15.3  13.0 - 17.0 g/dL Final  . HCT 12/19/2013 45.9  39.0 - 52.0 % Final  . MCV 12/19/2013 95.0  78.0 - 100.0 fL Final  . MCH 12/19/2013 31.7  26.0 - 34.0 pg Final  . MCHC 12/19/2013 33.3  30.0 - 36.0 g/dL Final  . RDW 12/19/2013 13.8  11.5 - 15.5 % Final  . Platelets 12/19/2013 159  150 - 400 K/uL Final  . MRSA, PCR 12/19/2013 NEGATIVE  NEGATIVE Final  . Staphylococcus aureus 12/19/2013 NEGATIVE  NEGATIVE Final   Comment:                                 The Xpert SA Assay (  FDA                          approved for NASAL specimens                          in patients over 25 years of age),                          is one component of                          a comprehensive surveillance                          program.  Test performance has                          been validated by [employer] for patients greater                          than or equal to 58 year old.                          It is not intended                          to diagnose infection nor to                          guide or monitor treatment.  Marland Kitchen aPTT 12/19/2013 32  24 - 37 seconds Final  . Sodium 12/19/2013 139  137 - 147 mEq/L Final  . Potassium 12/19/2013 4.4  3.7 - 5.3 mEq/L Final  . Chloride 12/19/2013 100  96 - 112 mEq/L Final  . CO2 12/19/2013 27  19 - 32 mEq/L Final  . Glucose, Bld 12/19/2013 103* 70 - 99 mg/dL Final  . BUN 12/19/2013 8  6 - 23 mg/dL Final  . Creatinine, Ser 12/19/2013 0.75  0.50 - 1.35 mg/dL Final  . Calcium 12/19/2013 9.3  8.4 - 10.5  mg/dL Final  . Total Protein 12/19/2013 7.1  6.0 - 8.3 g/dL Final  . Albumin 12/19/2013 3.9  3.5 - 5.2 g/dL Final  . AST 12/19/2013 17  0 - 37 U/L Final  . ALT 12/19/2013 13  0 - 53 U/L Final  . Alkaline Phosphatase 12/19/2013 115  39 - 117 U/L Final  . Total Bilirubin 12/19/2013 0.6  0.3 - 1.2 mg/dL Final  . GFR calc non Af Amer 12/19/2013 >90  >90 mL/min Final  . GFR calc Af Amer 12/19/2013 >90  >90 mL/min Final   Comment: (NOTE)                          The eGFR has been calculated using the CKD EPI equation.                          This calculation has not been validated in all clinical situations.  eGFR's persistently <90 mL/min signify possible Chronic Kidney                          Disease.  . Anion gap 12/19/2013 12  5 - 15 Final  . Prothrombin Time 12/19/2013 18.6* 11.6 - 15.2 seconds Final  . INR 12/19/2013 1.55* 0.00 - 1.49 Final  . Color, Urine 12/19/2013 YELLOW  YELLOW Final  . APPearance 12/19/2013 CLEAR  CLEAR Final  . Specific Gravity, Urine 12/19/2013 1.013  1.005 - 1.030 Final  . pH 12/19/2013 5.5  5.0 - 8.0 Final  . Glucose, UA 12/19/2013 NEGATIVE  NEGATIVE mg/dL Final  . Hgb urine dipstick 12/19/2013 NEGATIVE  NEGATIVE Final  . Bilirubin Urine 12/19/2013 NEGATIVE  NEGATIVE Final  . Ketones, ur 12/19/2013 NEGATIVE  NEGATIVE mg/dL Final  . Protein, ur 12/19/2013 NEGATIVE  NEGATIVE mg/dL Final  . Urobilinogen, UA 12/19/2013 0.2  0.0 - 1.0 mg/dL Final  . Nitrite 12/19/2013 NEGATIVE  NEGATIVE Final  . Leukocytes, UA 12/19/2013 NEGATIVE  NEGATIVE Final   MICROSCOPIC NOT DONE ON URINES WITH NEGATIVE PROTEIN, BLOOD, LEUKOCYTES, NITRITE, OR GLUCOSE <1000 mg/dL.     X-Rays:Dg Lumbar Spine 2-3 Views  12/19/2013   CLINICAL DATA:  Preop for lumbar spine surgery  EXAM: LUMBAR SPINE - 2-3 VIEW  COMPARISON:  CT lumbar spine of 11/12/2013 and CT chest of 06/24/2013  FINDINGS: Using the numbering system utilized on CT of the lumbar spine of 11/12/2013,  there are 6 non rib-bearing lumbar vertebra, with the most caudal vertebra being labeled S1 which is lumbarized. The lumbar vertebrae are straightened in alignment. Intervertebral disc spaces are relatively normal for age.  IMPRESSION: Six non rib-bearing lumbar vertebrae with somewhat straightened alignment. Relatively normal intervertebral disc spaces.   Electronically Signed   By: Ivar Drape M.D.   On: 12/19/2013 14:42   Dg Spine Portable 1 View  12/25/2013   ADDENDUM REPORT: 12/25/2013 10:25  ADDENDUM: When referring to an earlier myelogram on 11/12/2013 and following the same numbering of levels on this exam, the vertebral compression fracture is noted at the level of L5, and the position of the needle is at the intervertebral disc space of L5-S1 rather than L4-5. This was discussed by telephone with Dr. Gladstone Lighter at 1015 hrs.   Electronically Signed   By: Earle Gell M.D.   On: 12/25/2013 10:25   12/25/2013   CLINICAL DATA:  Intraoperative localization.  EXAM: PORTABLE SPINE - 1 VIEW  COMPARISON:  Prior study today  FINDINGS: Intraoperative crosstable lateral view labeled image 4 now shows a needle overlying the posterior margin of the intervertebral disc space at L4-5.  IMPRESSION: Intraoperative localization of L4-5 disc space.  Electronically Signed: By: Earle Gell M.D. On: 12/25/2013 10:04   Dg Spine Portable 1 View  12/25/2013   CLINICAL DATA:  Lumbar spine surgery.  EXAM: PORTABLE SPINE - 1 VIEW  COMPARISON:  12/25/2013.  FINDINGS: Lumbar vertebral are number with an lowest segmented lumbar appearing vertebral on portable lateral view as L5. Surgical probes are noted the L4-L5 disc space. Diffuse degenerative change. Mild L4 compression.  IMPRESSION: Surgical probes noted at the L4-L5 disc space level .   Electronically Signed   By: Marcello Moores  Register   On: 12/25/2013 09:50   Dg Spine Portable 1 View  12/25/2013   CLINICAL DATA:  L5-S1 surgery  EXAM: PORTABLE SPINE - 1 VIEW  COMPARISON:  Film from  earlier in the same day  FINDINGS: Surgical instruments are now noted posterior to the posterior elements at L5 and S1. The numbering nomenclature similar to that used on the prior examination.   Electronically Signed   By: Inez Catalina M.D.   On: 12/25/2013 09:19   Dg Spine Portable 1 View  12/25/2013   CLINICAL DATA:  Lumbar disc herniation. Intraoperative localization.  EXAM: PORTABLE SPINE - 1 VIEW  COMPARISON:  12/19/2013  FINDINGS: Following pattern established on previous exam, 6 non rib-bearing lumbar vertebra are noted with the inferior most lumbarized level referred to as S1. Following this pattern, needles are seen overlying the posterior spinous processes of the lowest lumbarized vertebra referred to as S1 and S2.  IMPRESSION: Intraoperative localization of spinous processes of S1 and S2, noting that there are 6 non rib-bearing lumbar vertebra with the inferior-most one referred to as S1.   Electronically Signed   By: Earle Gell M.D.   On: 12/25/2013 09:10     Hospital Course: Robert Gould is a 58 y.o. who was admitted to [facility] They were brought to the operating room on 12/25/2013 and underwent Procedure(s): HEMI-MICRODISCECTOMY LUMBAR LAMINECTOMY L5 - S1 ON THE RIGHT/FORAMINOTOMY L5 ON THE RIGHT LEVEL 1.  Patient tolerated the procedure well and was later transferred to the recovery room and then to the orthopaedic floor for postoperative care.  They were given PO and IV analgesics for pain control following their surgery.  They were given 24 hours of postoperative antibiotics of  Anti-infectives   Start     Dose/Rate Route Frequency Ordered Stop   12/25/13 1400  ceFAZolin (ANCEF) IVPB 1 g/50 mL premix     1 g 100 mL/hr over 30 Minutes Intravenous 3 times per day 12/25/13 1152 12/26/13 0542   12/25/13 0913  polymyxin B 500,000 Units, bacitracin 50,000 Units in sodium chloride irrigation 0.9 % 500 mL irrigation  Status:  Discontinued       As needed 12/25/13 0913 12/25/13  1040   12/25/13 0615  ceFAZolin (ANCEF) IVPB 2 g/50 mL premix     2 g 100 mL/hr over 30 Minutes Intravenous On call to O.R. 12/25/13 0615 12/25/13 0836    Coumadin was held for two days post op due to concern for post-operative bleeding.  PT was ordered.  Discharge planning consulted to help with postop disposition and equipment needs.  Patient had a difficult night on the evening of surgery and difficult post op day one due to nausea.  By day two, the patient had progressed with therapy and meeting their goals.  Incision was healing well.  Patient was seen in rounds and was ready to go home.   Diet: Cardiac diet Activity:WBAT Follow-up:in 3 days Disposition - Home Discharged Condition: stable   Discharge Instructions   Call MD / Call 911    Complete by:  As directed   If you experience chest pain or shortness of breath, CALL 911 and be transported to the hospital emergency room.  If you develope a fever above 101 F, pus (white drainage) or increased drainage or redness at the wound, or calf pain, call your surgeon's office.     Constipation Prevention    Complete by:  As directed   Drink plenty of fluids.  Prune juice may be helpful.  You may use a stool softener, such as Colace (over the counter) 100 mg twice a day.  Use MiraLax (over the counter) for constipation as needed.     Discharge instructions  Complete by:  As directed   Walk with your walker. Weight bearing as tolerated Change your dressing daily. Shower only, no tub bath. Call if any temperatures greater than 101 or any wound complications: [phone] during the day and ask for Dr. Charlestine Night nurse, Brunilda Payor.     Driving restrictions    Complete by:  As directed   No driving for 2 weeks     Increase activity slowly as tolerated    Complete by:  As directed      Lifting restrictions    Complete by:  As directed   No lifting            Medication List         albuterol 108 (90 BASE) MCG/ACT inhaler    Commonly known as:  PROVENTIL HFA;VENTOLIN HFA  Inhale 2 puffs into the lungs 4 (four) times daily.     albuterol (2.5 MG/3ML) 0.083% nebulizer solution  Commonly known as:  PROVENTIL  Take 2.5 mg by nebulization 2 (two) times daily.     ALPRAZolam 0.5 MG tablet  Commonly known as:  XANAX  Take 0.5 mg by mouth every 12 (twelve) hours.     docusate sodium 100 MG capsule  Commonly known as:  COLACE  Take 100 mg by mouth 2 (two) times daily.     fluticasone 50 MCG/ACT nasal spray  Commonly known as:  FLONASE  Place 2 sprays into both nostrils as needed.     Fluticasone-Salmeterol 250-50 MCG/DOSE Aepb  Commonly known as:  ADVAIR  Inhale 1 puff into the lungs 2 (two) times daily.     gabapentin 300 MG capsule  Commonly known as:  NEURONTIN  Take 1 capsule (300 mg total) by mouth 3 (three) times daily.     guaiFENesin 600 MG 12 hr tablet  Commonly known as:  MUCINEX  Take 600 mg by mouth 2 (two) times daily.     methocarbamol 500 MG tablet  Commonly known as:  ROBAXIN  Take 1 tablet (500 mg total) by mouth every 6 (six) hours as needed for muscle spasms.     omeprazole 40 MG capsule  Commonly known as:  PRILOSEC  Take 40 mg by mouth 2 (two) times daily.     oxyCODONE-acetaminophen 10-325 MG per tablet  Commonly known as:  PERCOCET  Take 1 tablet by mouth every 4 (four) hours as needed for pain.     polyethylene glycol powder powder  Commonly known as:  GLYCOLAX/MIRALAX  Take 17 g by mouth daily.     promethazine 12.5 MG tablet  Commonly known as:  PHENERGAN  Take 1-2 tablets (12.5-25 mg total) by mouth every 6 (six) hours as needed for nausea.     psyllium 0.52 G capsule  Commonly known as:  REGULOID  Take 3 capsules by mouth daily.     rOPINIRole 1 MG tablet  Commonly known as:  REQUIP  Take 1 mg by mouth at bedtime.     sertraline 100 MG tablet  Commonly known as:  ZOLOFT  Take 100 mg by mouth daily after supper.     simethicone 80 MG chewable tablet   Commonly known as:  MYLICON  Chew 80 mg by mouth 4 (four) times daily - after meals and at bedtime.     spironolactone 25 MG tablet  Commonly known as:  ALDACTONE  Take 25 mg by mouth every morning.     tamsulosin 0.4 MG Caps capsule  Commonly known as:  FLOMAX  Take 0.8 mg by mouth at bedtime.     tiotropium 18 MCG inhalation capsule  Commonly known as:  SPIRIVA  Place 18 mcg into inhaler and inhale every morning.     traZODone 50 MG tablet  Commonly known as:  DESYREL  Take 50 mg by mouth at bedtime.     warfarin 5 MG tablet  Commonly known as:  COUMADIN  Take 5 mg by mouth every morning.           Follow-up Information   Follow up with GIOFFRE,RONALD A, MD. Schedule an appointment as soon as possible for a visit on 12/30/2013.   Specialty:  Orthopedic Surgery   Contact information:   [address] [place] [address] [phone]       Signed: Ardeen Jourdain, PA-C Orthopaedic Surgery 01/06/2014, 10:55 PM

## 2014-11-15 IMAGING — CT CT L SPINE W/ CM
4 of 12 series · 11 of 33 positions shown, 13 images · non-contrast
Comparison: Abdominal CT 06/24/2013

CLINICAL DATA: Ruptured lumbar disc. Pain extending from the right
buttock into the right leg.
TECHNIQUE: Contiguous axial images were obtained through the Lumbar spine after
the intrathecal infusion of contrast. Coronal and sagittal
reconstructions were obtained of the axial image sets.

[Series 2: l spine bone · axial · 0.27mm/px · z∈[-448,-123]mm · 3 of 132 slices shown, 4 images]
[im 1/132  soft-tissue]
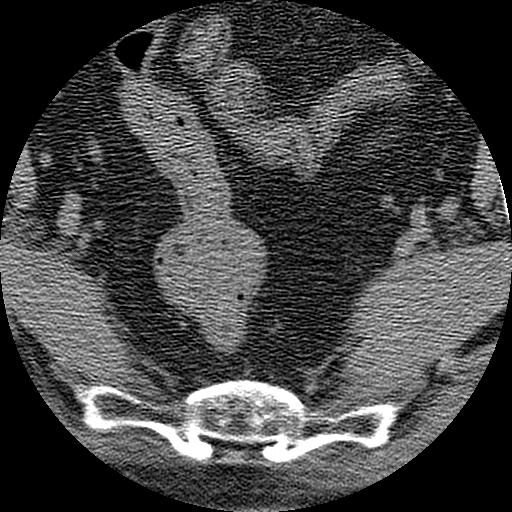
[im 1/132  bone]
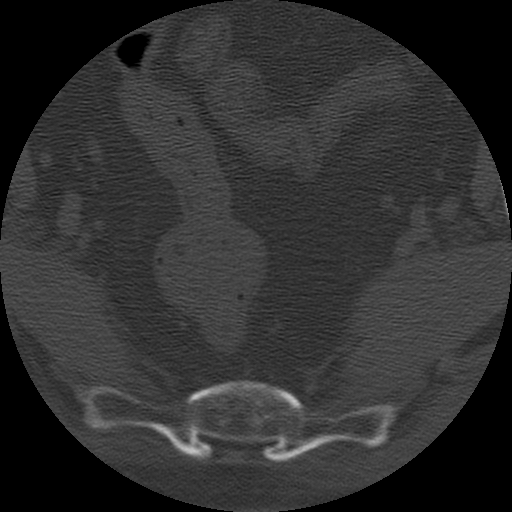
[im 66/132  bone]
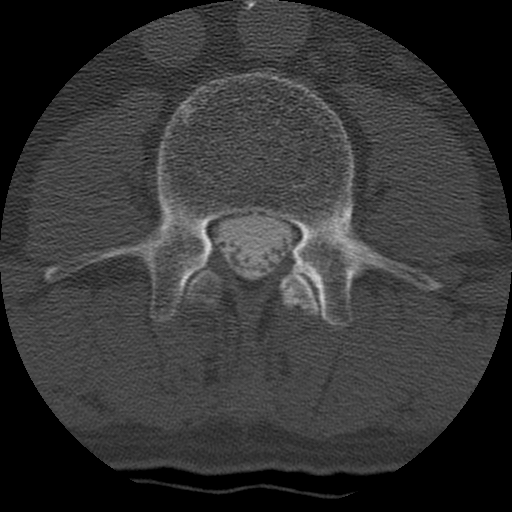
[im 132/132  bone]
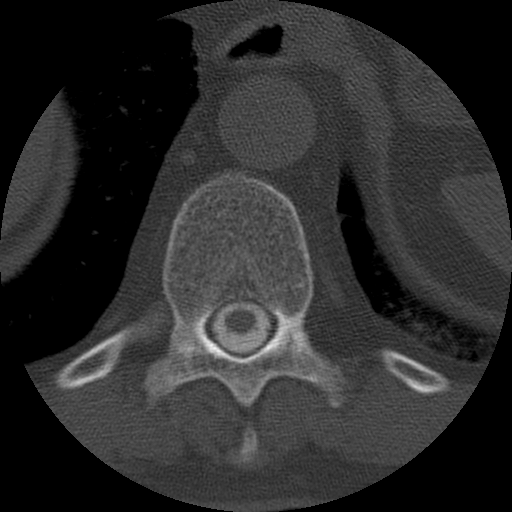

[Series 3: l spine soft · axial · 0.27mm/px · z∈[-340,-230]mm · 2 of 132 slices shown]
[im 44/132  soft-tissue]
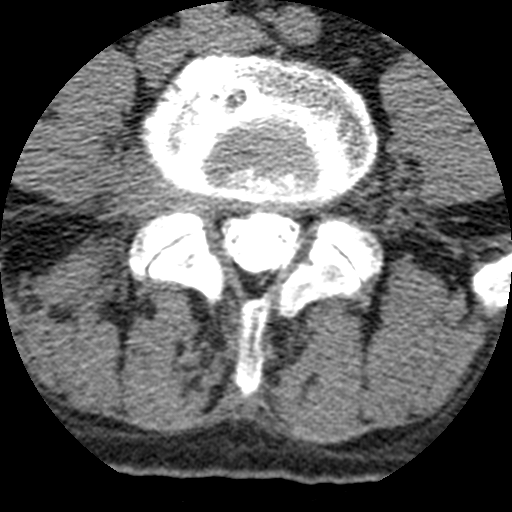
[im 88/132  soft-tissue]
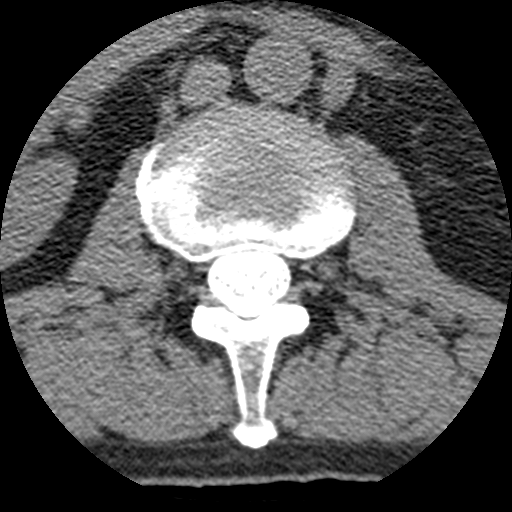

[Series 402: cor lower · coronal · 0.65mm/px · 1 of 51 slices shown]
[im 4/51  bone]
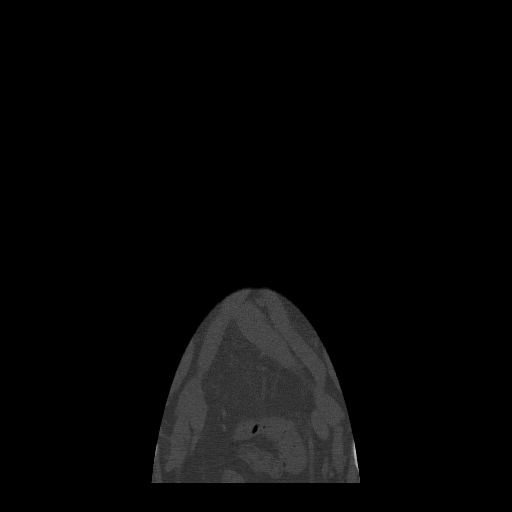

[Series 403: sag · sagittal · 0.65mm/px · 5 of 64 slices shown, 6 images]
[im 22/64  bone]
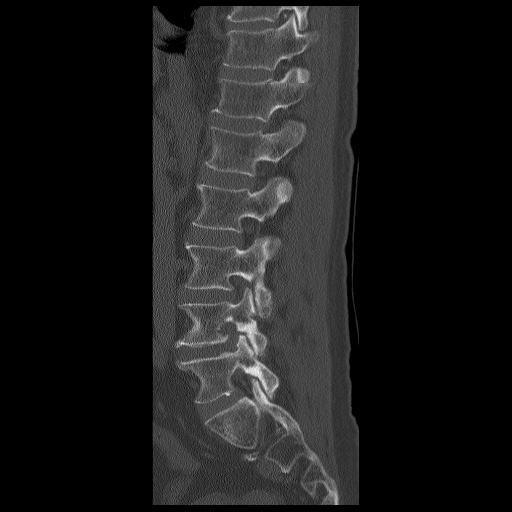
[im 27/64  bone]
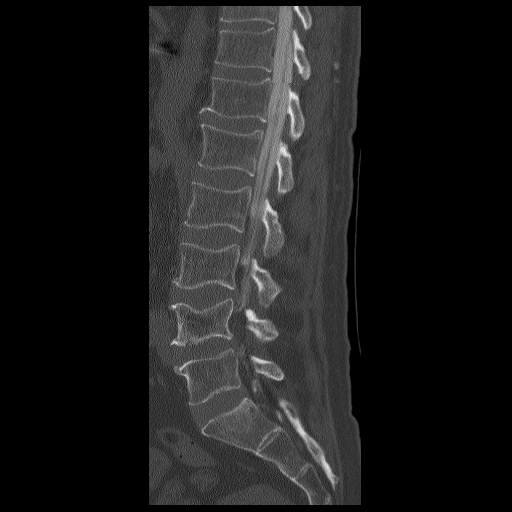
[im 32/64  soft-tissue]
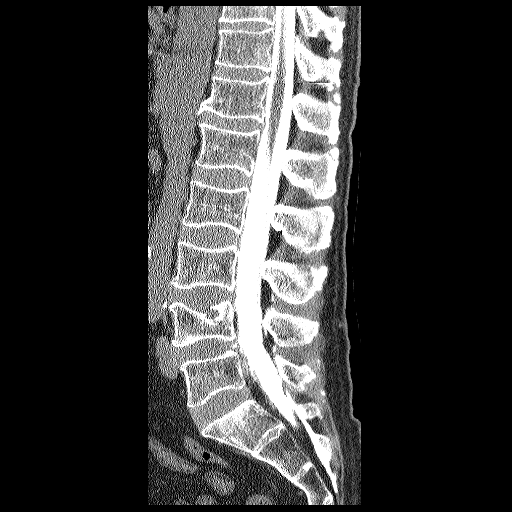
[im 32/64  bone]
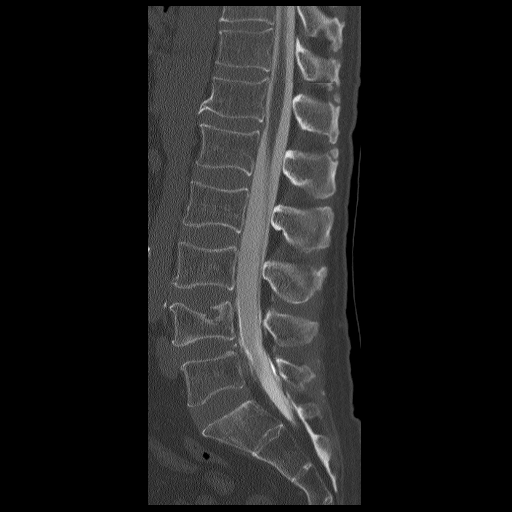
[im 37/64  bone]
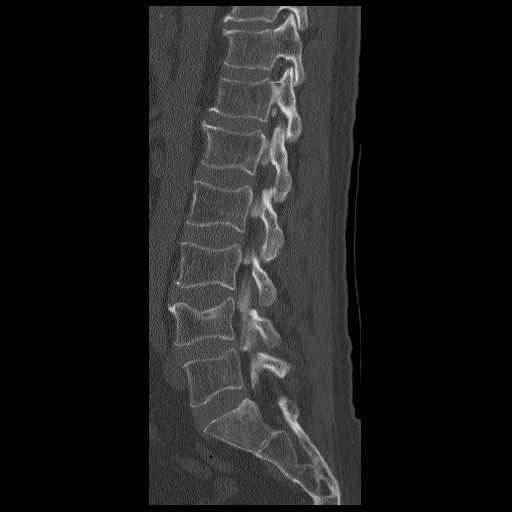
[im 43/64  bone]
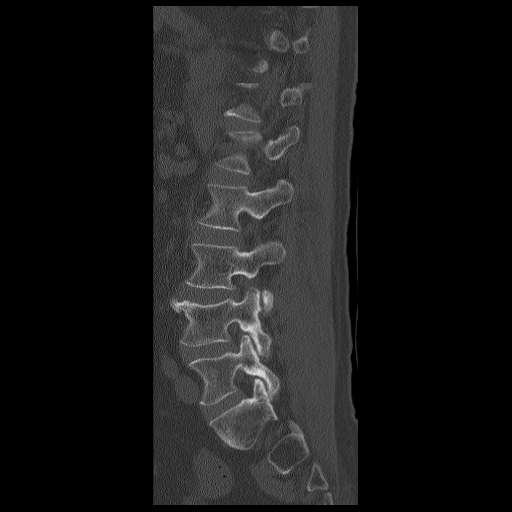

[11 of 33 positions shown; findings below may reference images not displayed]

EXAM:
LUMBAR MYELOGRAM

FLUOROSCOPY TIME:  0 min 37 seconds

PROCEDURE:
After thorough discussion of risks and benefits of the procedure
including bleeding, infection, injury to nerves, blood vessels,
adjacent structures as well as headache and CSF leak, written and
oral informed consent was obtained. Consent was obtained by Dr.
Kuniyo Tomo. Time out form was completed.

Patient was positioned prone on the fluoroscopy table. Local
anesthesia was provided with 1% lidocaine without epinephrine after
prepped and draped in the usual sterile fashion. Puncture was
performed at L5-S1 using a 3 1/2 inch 22-gauge spinal needle via
left paramedian approach. Using a single pass through the dura, the
needle was placed within the thecal sac, with return of clear CSF.
15 mL of [id number] was injected into the thecal sac, with normal
opacification of the nerve roots and cauda equina consistent with
free flow within the subarachnoid space.

I personally performed the lumbar puncture and administered the
intrathecal contrast. I also personally supervised acquisition of
the myelogram images.
FINDINGS: LUMBAR MYELOGRAM FINDINGS:

The patient has 6 non-rib-bearing lumbar type vertebral bodies. When
correlating with prior imaging, 12 pairs of thoracic ribs are seen.
For numbering purposes on this dictation, the lowest lumbar type
vertebral body will be considered a lumbarized S1. Vertebral
alignment is normal. L5 superior endplate compression fracture is
again seen. Small ventral epidural defects are seen L4-5 and L5-S1
without evidence of stenosis.

CT LUMBAR MYELOGRAM FINDINGS:

Vertebral alignment is normal. L5 superior endplate compression
fracture is unchanged from prior abdominal CT. Superior and inferior
endplate Schmorl's nodes are noted at L5. Intervertebral disc space
heights are preserved. Mild anterolateral endplate osteophyte
formation is present at L1-2, L4-5, and L5-S1. Conus medullaris
terminates at the mid to superior aspect of L2. Minimal distal
abdominal aortic atherosclerotic calcification is noted.

L1-2:  Trace disc bulge without stenosis.

L2-3:  Negative.

L3-4:  Negative.

L4-5: Mild disc bulge results in mild bilateral neural foraminal
stenosis. No spinal canal stenosis.

L5-S1: Large right foraminal/extraforaminal disc extrusion along
with endplate spurring results in severe right neural foraminal
stenosis with mass effect on the right L5 nerve in and lateral to
the neural foramen. No spinal canal stenosis.

S1-2: Fully formed intervertebral disc without herniation or
stenosis.
IMPRESSION: 1. Variant spinal anatomy as above.
2. Large foraminal/extraforaminal disc extrusion at L5-S1 impinging
on the right L5 nerve in and lateral to the neural foramen.

## 2014-12-28 IMAGING — DX DG SPINE 1V PORT
1 series · 1 of 1 positions shown · non-contrast
Comparison: 12/19/2013

CLINICAL DATA: Lumbar disc herniation. Intraoperative localization.

EXAM:
PORTABLE SPINE - 1 VIEW

[l-spine x-table]
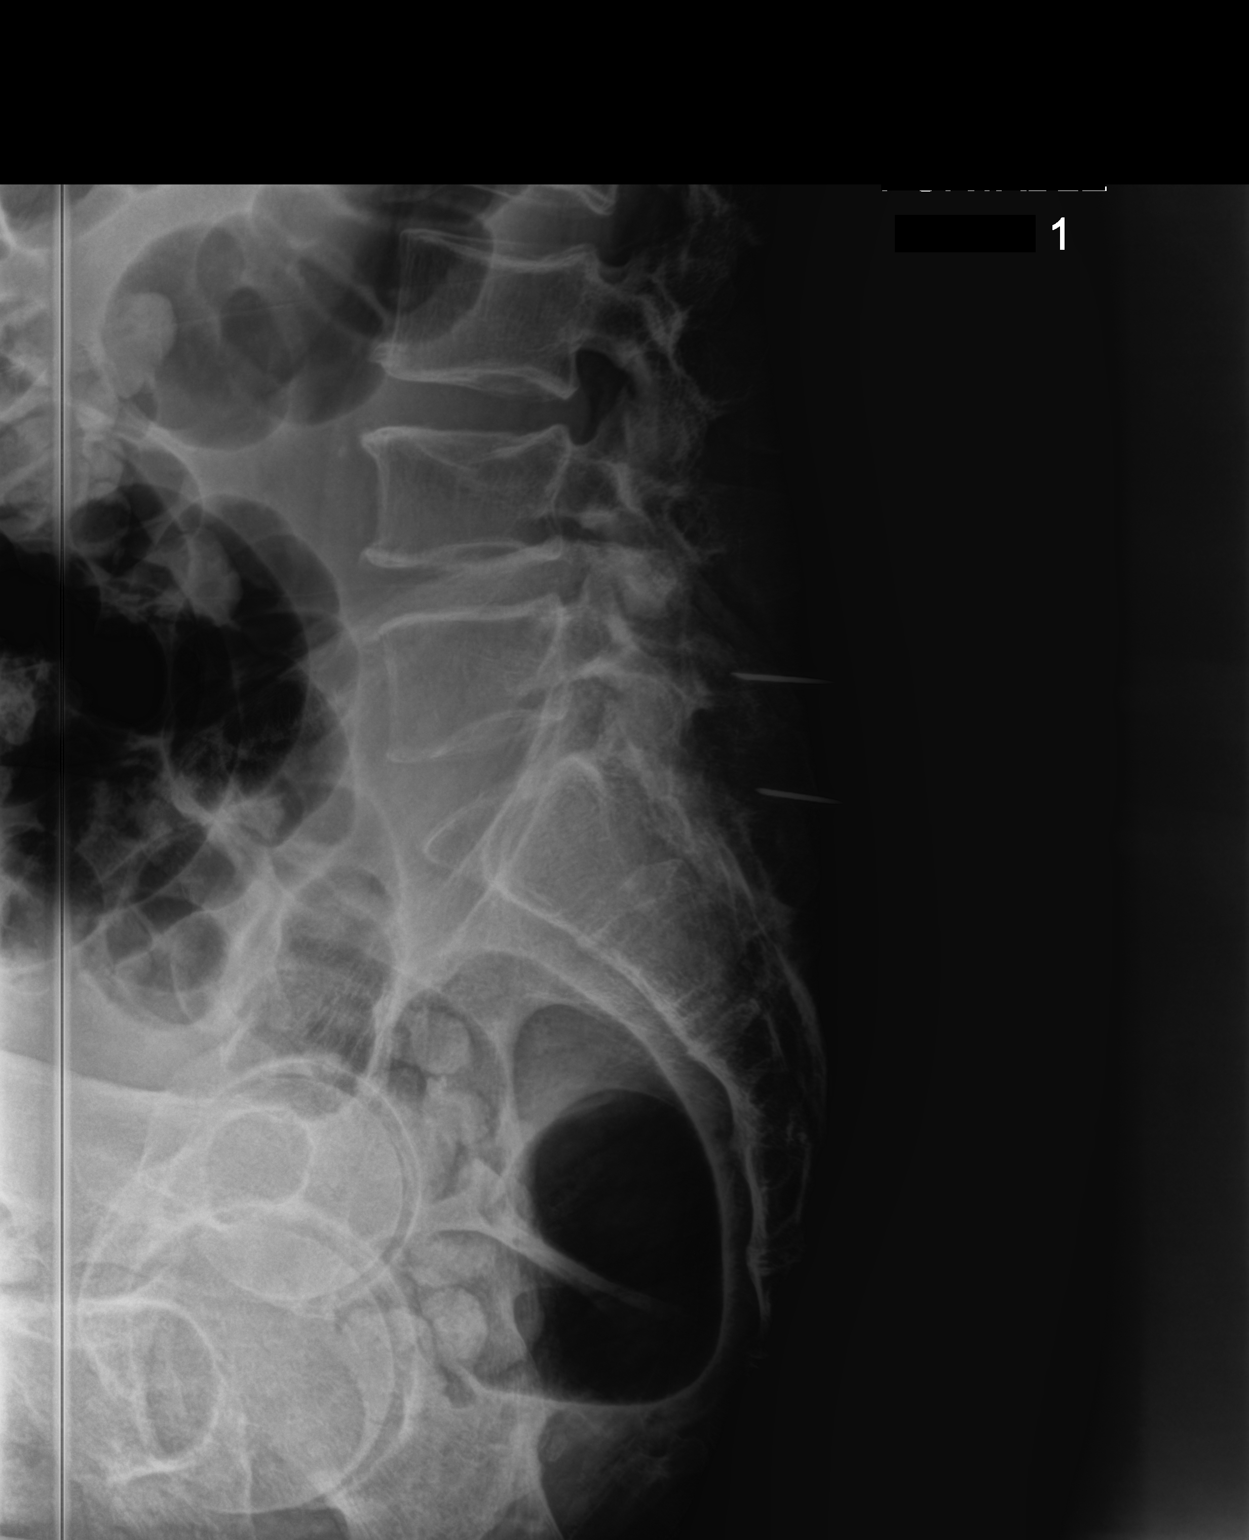

[1 of 1 positions shown; findings below may reference images not displayed]

FINDINGS: Following pattern established on previous exam, 6 non rib-bearing
lumbar vertebra are noted with the inferior most lumbarized level
referred to as S1. Following this pattern, needles are seen
overlying the posterior spinous processes of the lowest lumbarized
vertebra referred to as S1 and S2.
IMPRESSION: Intraoperative localization of spinous processes of S1 and S2,
noting that there are 6 non rib-bearing lumbar vertebra with the
inferior-most one referred to as S1.
